# Patient Record
Sex: Male | Born: 1960 | Race: Black or African American | Marital: Married | Smoking: Never smoker
Health system: Southern US, Community
[De-identification: ages and names within clinical notes are randomized; demographics above are authoritative.]

## PROBLEM LIST (undated history)

## (undated) ENCOUNTER — Emergency Department (HOSPITAL_COMMUNITY): Admission: EM | Discharge status: Absent without leave | Payer: BLUE CROSS/BLUE SHIELD

## (undated) DIAGNOSIS — E119 Type 2 diabetes mellitus without complications: Secondary | ICD-10-CM

---

## 2012-12-05 ENCOUNTER — Other Ambulatory Visit: Payer: Self-pay | Admitting: Infectious Diseases

## 2012-12-05 ENCOUNTER — Ambulatory Visit
Admission: RE | Admit: 2012-12-05 | Discharge: 2012-12-05 | Disposition: A | Discharge status: Home / Self Care | Payer: No Typology Code available for payment source | Source: Ambulatory Visit | Attending: Infectious Diseases | Admitting: Infectious Diseases

## 2012-12-05 DIAGNOSIS — R7611 Nonspecific reaction to tuberculin skin test without active tuberculosis: Secondary | ICD-10-CM

## 2015-12-04 ENCOUNTER — Encounter (HOSPITAL_COMMUNITY): Payer: Self-pay | Admitting: Emergency Medicine

## 2015-12-04 ENCOUNTER — Emergency Department (HOSPITAL_COMMUNITY): Payer: BLUE CROSS/BLUE SHIELD

## 2015-12-04 ENCOUNTER — Inpatient Hospital Stay (HOSPITAL_COMMUNITY)
Admission: EM | Admit: 2015-12-04 | Discharge: 2015-12-07 | DRG: 809 | Disposition: A | Discharge status: Home / Self Care | Payer: BLUE CROSS/BLUE SHIELD | Attending: Internal Medicine | Admitting: Internal Medicine

## 2015-12-04 DIAGNOSIS — R509 Fever, unspecified: Secondary | ICD-10-CM | POA: Diagnosis not present

## 2015-12-04 DIAGNOSIS — D72819 Decreased white blood cell count, unspecified: Secondary | ICD-10-CM | POA: Diagnosis not present

## 2015-12-04 DIAGNOSIS — R6510 Systemic inflammatory response syndrome (SIRS) of non-infectious origin without acute organ dysfunction: Secondary | ICD-10-CM | POA: Diagnosis not present

## 2015-12-04 DIAGNOSIS — E119 Type 2 diabetes mellitus without complications: Secondary | ICD-10-CM

## 2015-12-04 DIAGNOSIS — D649 Anemia, unspecified: Secondary | ICD-10-CM | POA: Diagnosis not present

## 2015-12-04 DIAGNOSIS — Z833 Family history of diabetes mellitus: Secondary | ICD-10-CM

## 2015-12-04 DIAGNOSIS — D61818 Other pancytopenia: Secondary | ICD-10-CM | POA: Diagnosis not present

## 2015-12-04 DIAGNOSIS — Z7984 Long term (current) use of oral hypoglycemic drugs: Secondary | ICD-10-CM | POA: Diagnosis not present

## 2015-12-04 DIAGNOSIS — E876 Hypokalemia: Secondary | ICD-10-CM | POA: Diagnosis not present

## 2015-12-04 DIAGNOSIS — I959 Hypotension, unspecified: Secondary | ICD-10-CM | POA: Diagnosis not present

## 2015-12-04 DIAGNOSIS — I10 Essential (primary) hypertension: Secondary | ICD-10-CM | POA: Diagnosis not present

## 2015-12-04 DIAGNOSIS — Z79899 Other long term (current) drug therapy: Secondary | ICD-10-CM

## 2015-12-04 DIAGNOSIS — D696 Thrombocytopenia, unspecified: Secondary | ICD-10-CM | POA: Diagnosis not present

## 2015-12-04 HISTORY — DX: Type 2 diabetes mellitus without complications: E11.9

## 2015-12-04 LAB — CBC WITH DIFFERENTIAL/PLATELET
BASOS PCT: 0 %
Basophils Absolute: 0 10*3/uL (ref 0.0–0.1)
Eosinophils Absolute: 0.4 10*3/uL (ref 0.0–0.7)
Eosinophils Relative: 12 %
HCT: 35.4 % — ABNORMAL LOW (ref 39.0–52.0)
HEMOGLOBIN: 12.5 g/dL — AB (ref 13.0–17.0)
LYMPHS ABS: 0.3 10*3/uL — AB (ref 0.7–4.0)
LYMPHS PCT: 10 %
MCH: 31.6 pg (ref 26.0–34.0)
MCHC: 35.3 g/dL (ref 30.0–36.0)
MCV: 89.6 fL (ref 78.0–100.0)
MONOS PCT: 8 %
Monocytes Absolute: 0.3 10*3/uL (ref 0.1–1.0)
NEUTROS PCT: 69 %
Neutro Abs: 2.1 10*3/uL (ref 1.7–7.7)
Platelets: 57 10*3/uL — ABNORMAL LOW (ref 150–400)
RBC: 3.95 MIL/uL — ABNORMAL LOW (ref 4.22–5.81)
RDW: 14 % (ref 11.5–15.5)
WBC: 3 10*3/uL — ABNORMAL LOW (ref 4.0–10.5)

## 2015-12-04 LAB — COMPREHENSIVE METABOLIC PANEL
ALT: 38 U/L (ref 17–63)
ANION GAP: 11 (ref 5–15)
AST: 36 U/L (ref 15–41)
Albumin: 3.4 g/dL — ABNORMAL LOW (ref 3.5–5.0)
Alkaline Phosphatase: 34 U/L — ABNORMAL LOW (ref 38–126)
BUN: 18 mg/dL (ref 6–20)
CALCIUM: 7.9 mg/dL — AB (ref 8.9–10.3)
CHLORIDE: 107 mmol/L (ref 101–111)
CO2: 19 mmol/L — AB (ref 22–32)
Creatinine, Ser: 1.19 mg/dL (ref 0.61–1.24)
GFR calc non Af Amer: >60 mL/min (ref >60)
Glucose, Bld: 167 mg/dL — ABNORMAL HIGH (ref 65–99)
POTASSIUM: 2.9 mmol/L — AB (ref 3.5–5.1)
SODIUM: 137 mmol/L (ref 135–145)
Total Bilirubin: 1.3 mg/dL — ABNORMAL HIGH (ref 0.3–1.2)
Total Protein: 6 g/dL — ABNORMAL LOW (ref 6.5–8.1)

## 2015-12-04 LAB — MAGNESIUM: Magnesium: 1.5 mg/dL — ABNORMAL LOW (ref 1.7–2.4)

## 2015-12-04 LAB — I-STAT CG4 LACTIC ACID, ED: LACTIC ACID, VENOUS: 1.34 mmol/L (ref 0.5–2.0)

## 2015-12-04 MED ORDER — PIPERACILLIN-TAZOBACTAM 3.375 G IVPB
3.3750 g | Freq: Once | INTRAVENOUS | Status: AC
  Administered 2015-12-04: 3.375 g via INTRAVENOUS
  Filled 2015-12-04: qty 50

## 2015-12-04 MED ORDER — VANCOMYCIN HCL IN DEXTROSE 1-5 GM/200ML-% IV SOLN
1000.0000 mg | INTRAVENOUS | Status: AC
  Administered 2015-12-04: 1000 mg via INTRAVENOUS
  Filled 2015-12-04: qty 200

## 2015-12-04 MED ORDER — SODIUM CHLORIDE 0.9 % IV BOLUS (SEPSIS)
1000.0000 mL | INTRAVENOUS | Status: AC
  Administered 2015-12-04 (×2): 1000 mL via INTRAVENOUS

## 2015-12-04 MED ORDER — VANCOMYCIN HCL IN DEXTROSE 1-5 GM/200ML-% IV SOLN
1000.0000 mg | Freq: Two times a day (BID) | INTRAVENOUS | Status: DC
  Filled 2015-12-04: qty 200

## 2015-12-04 MED ORDER — SODIUM CHLORIDE 0.9 % IV BOLUS (SEPSIS)
500.0000 mL | INTRAVENOUS | Status: AC
Start: 2015-12-04 — End: 2015-12-05
  Administered 2015-12-04: 500 mL via INTRAVENOUS

## 2015-12-04 MED ORDER — POTASSIUM CHLORIDE CRYS ER 20 MEQ PO TBCR
40.0000 meq | EXTENDED_RELEASE_TABLET | Freq: Once | ORAL | Status: AC
  Administered 2015-12-04: 40 meq via ORAL
  Filled 2015-12-04: qty 2

## 2015-12-04 NOTE — ED Notes (Signed)
Pt has been made aware that a urine sample is needed, urinal at the bedside

## 2015-12-04 NOTE — Progress Notes (Addendum)
Pharmacy Antibiotic Note  Marc Clark is a 55 y.o. male [facility]/2015 with hypotension, fatigue, and fever.  Pharmacy has been consulted for Vancomycin and Zosyn dosing for sepsis.   Plan: Vancomycin 1g IV q12h. Plan for Vancomycin trough level at steady state. Goal trough level 15-20 mcg/mL. Zosyn 3.375g IV x 1 given in ED over 30 minutes. Continue with Zosyn 3.375g IV q8h (infuse over 4 hours). Monitor renal function, cultures, clinical course.  Height: 5\' 9"  (175.3 cm) Weight: 185 lb (83.915 kg) IBW/kg (Calculated) : 70.7  Temp (24hrs), Avg:101.2 F (38.4 C), Min:100 F (37.8 C), Max:102.4 F (39.1 C)   Recent Labs Lab 12/04/15 2110 12/04/15 2122  WBC 3.0*  --   CREATININE 1.19  --   LATICACIDVEN  --  1.34    Estimated Creatinine Clearance: 72.6 mL/min (by C-G formula based on Cr of 1.19).    No Known Allergies  Antimicrobials this admission: 4/28 >> Zosyn >> 4/28 >> Vancomycin >>  Dose adjustments this admission: ---  Microbiology results: 4/28 BCx: sent 4/28 UCx: sent  Thank you for allowing pharmacy to be a part of this patient's care.    Greer PickerelJigna Landri Dorsainvil, PharmD, BCPS Pager: [phone] 12/04/2015 10:45 PM

## 2015-12-04 NOTE — ED Notes (Addendum)
Pt from an urgent care via EMS with complaints of hypotension and lethargy x 4 days. Pt denies dizziness, lightheadedness ,LOC, n, v, d. At the urgent care the pt's bp was 77/56. At time of assessment, pt's bp is 94/57. Pt states he has not eaten in 2 days except for a hamburger he ate before he went to the UC this afternoon. Pt has an IV and has had about 550ml of NS. Pt had a fever of 100.4 at UC and EMS gave 1000mg  of tylenol PO

## 2015-12-04 NOTE — ED Provider Notes (Signed)
CSN: [id number]     Arrival date & time 12/04/15  1935 History   First MD Initiated Contact with Patient 12/04/15 2049     Chief Complaint  Patient presents with  . Hypotension  . Fatigue     The history is provided by the patient. No language interpreter was used.  Marc Clark is a 55 y.o. male who presents to the Emergency Department complaining of weakness.  Patient reports 4 days of generalized weakness, fatigue, malaise. He presented to urgent care for evaluation and was noted to be hypotensive with a blood pressure 77/56. He denies any fever, cough, nausea, vomiting, diarrhea, dysuria. He has a history of diabetes and takes metformin, no other medical problems. He is from [place] but has lived in the [place] for the last 15 years with no recent travel. No significant past family medical history. Symptoms are severe, constant, worsening.  Past Medical History  Diagnosis Date  . Diabetes mellitus type 2 in nonobese (HCC)   . Diabetes mellitus type 2, controlled (HCC) 12/05/2015   History reviewed. No pertinent past surgical history. Family History  Problem Relation Age of Onset  . Diabetes Mellitus II Mother    Social History  Substance Use Topics  . Smoking status: Never Smoker   . Smokeless tobacco: None  . Alcohol Use: No    Review of Systems  All other systems reviewed and are negative.     Allergies  Review of patient's allergies indicates no known allergies.  Home Medications   Prior to Admission medications   Medication Sig Start Date End Date Taking? Authorizing Provider  acetaminophen (TYLENOL) 500 MG tablet Take 1,000 mg by mouth every 6 (six) hours as needed for moderate pain.   Yes Historical Provider, MD  metFORMIN (GLUCOPHAGE) 500 MG tablet Take 500 mg by mouth 2 (two) times daily with a meal.   Yes Historical Provider, MD  Multiple Vitamin (MULTIVITAMIN WITH MINERALS) TABS tablet Take 1 tablet by mouth daily.   Yes Historical Provider, MD  PRESCRIPTION  MEDICATION Take 1 tablet by mouth daily. For diabetes   Yes Historical Provider, MD   BP 94/55 mmHg  Pulse 64  Temp(Src) 98.2 F (36.8 C) (Oral)  Resp 20  Ht 5\' 9"  (1.753 m)  Wt 190 lb 11.2 oz (86.5 kg)  BMI 28.15 kg/m2  SpO2 100% Physical Exam  Constitutional: He is oriented to person, place, and time. He appears well-developed and well-nourished.  HENT:  Head: Normocephalic and atraumatic.  Cardiovascular: Normal rate and regular rhythm.   No murmur heard. Pulmonary/Chest: Effort normal and breath sounds normal. No respiratory distress.  Abdominal: Soft. There is no tenderness. There is no rebound and no guarding.  Musculoskeletal: He exhibits no edema or tenderness.  Neurological: He is alert and oriented to person, place, and time.  Skin: Skin is warm and dry.  Psychiatric: He has a normal mood and affect. His behavior is normal.  Nursing note and vitals reviewed.   ED Course  Procedures (including critical care time) Labs Review Labs Reviewed  COMPREHENSIVE METABOLIC PANEL - Abnormal; Notable for the following:    Potassium 2.9 (*)    CO2 19 (*)    Glucose, Bld 167 (*)    Calcium 7.9 (*)    Total Protein 6.0 (*)    Albumin 3.4 (*)    Alkaline Phosphatase 34 (*)    Total Bilirubin 1.3 (*)    All other components within normal limits  CBC WITH DIFFERENTIAL/PLATELET - Abnormal;  Notable for the following:    WBC 3.0 (*)    RBC 3.95 (*)    Hemoglobin 12.5 (*)    HCT 35.4 (*)    Platelets 57 (*)    Lymphs Abs 0.3 (*)    All other components within normal limits  URINALYSIS, ROUTINE W REFLEX MICROSCOPIC (NOT AT [facility]) - Abnormal; Notable for the following:    Specific Gravity, Urine 1.039 (*)    Glucose, UA >1000 (*)    Hgb urine dipstick TRACE (*)    Ketones, ur 40 (*)    All other components within normal limits  MAGNESIUM - Abnormal; Notable for the following:    Magnesium 1.5 (*)    All other components within normal limits  URINE MICROSCOPIC-ADD ON -  Abnormal; Notable for the following:    Squamous Epithelial / LPF 6-30 (*)    Bacteria, UA RARE (*)    All other components within normal limits  CULTURE, BLOOD (ROUTINE X 2)  CULTURE, BLOOD (ROUTINE X 2)  URINE CULTURE  HIV ANTIBODY (ROUTINE TESTING)  COMPREHENSIVE METABOLIC PANEL  CBC WITH DIFFERENTIAL/PLATELET  PATHOLOGIST SMEAR REVIEW  LACTATE DEHYDROGENASE  MAGNESIUM  I-STAT CG4 LACTIC ACID, ED    Imaging Review Dg Chest Port 1 View  12/04/2015  CLINICAL DATA:  Acute onset of fever and lethargy. Hypotension. Initial encounter. EXAM: PORTABLE CHEST 1 VIEW COMPARISON:  Chest radiograph performed 12/05/2012 FINDINGS: The lungs are well-aerated. Pulmonary vascularity is at the upper limits of normal. There is no evidence of focal opacification, pleural effusion or pneumothorax. The cardiomediastinal silhouette is within normal limits. No acute osseous abnormalities are seen. IMPRESSION: No acute cardiopulmonary process seen. Electronically Signed   By: Roanna Raider M.D.   On: 12/04/2015 21:57   I have personally reviewed and evaluated these images and lab results as part of my medical decision-making.   EKG Interpretation   Date/Time:  Friday December 04 2015 20:57:20 EDT Ventricular Rate:  80 PR Interval:  201 QRS Duration: 80 QT Interval:  370 QTC Calculation: 427 R Axis:   62 Text Interpretation:  Sinus rhythm Confirmed by Lincoln Brigham [phone]) on  12/04/2015 9:03:00 PM      MDM   Final diagnoses:  None    Patient here for evaluation of fatigue and lethargy. He is hypotensive on ED arrival sepsis protocol initiated due to concern for potential underlying infection. On further evaluation there is no evidence of clear infection at this time. Patient was started on antibiotics pending further evaluation. He is noted to be febrile with pancytopenia, hypokalemia.  No evidence of acute UTI, pneumonia, intra-abdominal process, no skin lesions. Hypotension improved after IV fluid  administration. Plan to admit to the hospitalist service for further evaluation. Patient updated of findings of studies recommendation for admission.    Tilden Fossa, MD 12/05/15 [phone]

## 2015-12-05 ENCOUNTER — Encounter (HOSPITAL_COMMUNITY): Payer: Self-pay | Admitting: Internal Medicine

## 2015-12-05 ENCOUNTER — Inpatient Hospital Stay (HOSPITAL_COMMUNITY): Payer: BLUE CROSS/BLUE SHIELD

## 2015-12-05 DIAGNOSIS — R6510 Systemic inflammatory response syndrome (SIRS) of non-infectious origin without acute organ dysfunction: Secondary | ICD-10-CM | POA: Diagnosis present

## 2015-12-05 DIAGNOSIS — D61818 Other pancytopenia: Secondary | ICD-10-CM | POA: Diagnosis present

## 2015-12-05 DIAGNOSIS — E119 Type 2 diabetes mellitus without complications: Secondary | ICD-10-CM

## 2015-12-05 HISTORY — DX: Type 2 diabetes mellitus without complications: E11.9

## 2015-12-05 LAB — COMPREHENSIVE METABOLIC PANEL
ALBUMIN: 3.2 g/dL — AB (ref 3.5–5.0)
ALT: 37 U/L (ref 17–63)
AST: 31 U/L (ref 15–41)
Alkaline Phosphatase: 33 U/L — ABNORMAL LOW (ref 38–126)
Anion gap: 7 (ref 5–15)
BUN: 16 mg/dL (ref 6–20)
CHLORIDE: 112 mmol/L — AB (ref 101–111)
CO2: 23 mmol/L (ref 22–32)
Calcium: 7.8 mg/dL — ABNORMAL LOW (ref 8.9–10.3)
Creatinine, Ser: 1.1 mg/dL (ref 0.61–1.24)
GFR calc Af Amer: >60 mL/min (ref >60)
GLUCOSE: 158 mg/dL — AB (ref 65–99)
POTASSIUM: 4.3 mmol/L (ref 3.5–5.1)
SODIUM: 142 mmol/L (ref 135–145)
Total Bilirubin: 0.8 mg/dL (ref 0.3–1.2)
Total Protein: 5.9 g/dL — ABNORMAL LOW (ref 6.5–8.1)

## 2015-12-05 LAB — CBC WITH DIFFERENTIAL/PLATELET
Basophils Absolute: 0 10*3/uL (ref 0.0–0.1)
Basophils Relative: 1 %
EOS PCT: 6 %
Eosinophils Absolute: 0.3 10*3/uL (ref 0.0–0.7)
HCT: 34.3 % — ABNORMAL LOW (ref 39.0–52.0)
Hemoglobin: 11.9 g/dL — ABNORMAL LOW (ref 13.0–17.0)
LYMPHS ABS: 1 10*3/uL (ref 0.7–4.0)
LYMPHS PCT: 23 %
MCH: 30.4 pg (ref 26.0–34.0)
MCHC: 34.7 g/dL (ref 30.0–36.0)
MCV: 87.7 fL (ref 78.0–100.0)
MONOS PCT: 12 %
Monocytes Absolute: 0.5 10*3/uL (ref 0.1–1.0)
Neutro Abs: 2.5 10*3/uL (ref 1.7–7.7)
Neutrophils Relative %: 59 %
PLATELETS: 52 10*3/uL — AB (ref 150–400)
RBC: 3.91 MIL/uL — AB (ref 4.22–5.81)
RDW: 14.4 % (ref 11.5–15.5)
WBC: 4.3 10*3/uL (ref 4.0–10.5)

## 2015-12-05 LAB — URINALYSIS, ROUTINE W REFLEX MICROSCOPIC
Bilirubin Urine: NEGATIVE
Glucose, UA: >1000 mg/dL — AB
Ketones, ur: 40 mg/dL — AB
LEUKOCYTES UA: NEGATIVE
Nitrite: NEGATIVE
PROTEIN: NEGATIVE mg/dL
SPECIFIC GRAVITY, URINE: 1.039 — AB (ref 1.005–1.030)
pH: 5 (ref 5.0–8.0)

## 2015-12-05 LAB — GLUCOSE, CAPILLARY
GLUCOSE-CAPILLARY: 93 mg/dL (ref 65–99)
GLUCOSE-CAPILLARY: 111 mg/dL — AB (ref 65–99)
Glucose-Capillary: 94 mg/dL (ref 65–99)
Glucose-Capillary: 121 mg/dL — ABNORMAL HIGH (ref 65–99)

## 2015-12-05 LAB — LACTATE DEHYDROGENASE: LDH: 243 U/L — ABNORMAL HIGH (ref 98–192)

## 2015-12-05 LAB — URINE MICROSCOPIC-ADD ON

## 2015-12-05 LAB — MAGNESIUM: Magnesium: 2.4 mg/dL (ref 1.7–2.4)

## 2015-12-05 MED ORDER — SODIUM CHLORIDE 0.45 % IV SOLN
INTRAVENOUS | Status: DC
  Administered 2015-12-05: 50 mL/h via INTRAVENOUS
  Administered 2015-12-06: 05:00:00 via INTRAVENOUS

## 2015-12-05 MED ORDER — INSULIN ASPART 100 UNIT/ML ~~LOC~~ SOLN: 0.0000 [IU] | Freq: Every day | SUBCUTANEOUS | Status: DC

## 2015-12-05 MED ORDER — ONDANSETRON HCL 4 MG/2ML IJ SOLN: 4.0000 mg | Freq: Four times a day (QID) | INTRAMUSCULAR | Status: DC | PRN

## 2015-12-05 MED ORDER — INSULIN ASPART 100 UNIT/ML ~~LOC~~ SOLN: 0.0000 [IU] | Freq: Three times a day (TID) | SUBCUTANEOUS | Status: DC

## 2015-12-05 MED ORDER — INSULIN ASPART 100 UNIT/ML ~~LOC~~ SOLN
0.0000 [IU] | Freq: Three times a day (TID) | SUBCUTANEOUS | Status: DC
  Administered 2015-12-06: 2 [IU] via SUBCUTANEOUS

## 2015-12-05 MED ORDER — ACETAMINOPHEN 325 MG PO TABS
650.0000 mg | ORAL_TABLET | Freq: Four times a day (QID) | ORAL | Status: DC | PRN
  Administered 2015-12-06 – 2015-12-07 (×3): 650 mg via ORAL
  Filled 2015-12-05 (×3): qty 2

## 2015-12-05 MED ORDER — PIPERACILLIN-TAZOBACTAM 3.375 G IVPB
3.3750 g | Freq: Three times a day (TID) | INTRAVENOUS | Status: DC
  Administered 2015-12-05: 3.375 g via INTRAVENOUS
  Filled 2015-12-05 (×2): qty 50

## 2015-12-05 MED ORDER — POTASSIUM CHLORIDE CRYS ER 20 MEQ PO TBCR
40.0000 meq | EXTENDED_RELEASE_TABLET | Freq: Once | ORAL | Status: AC
  Administered 2015-12-05: 40 meq via ORAL
  Filled 2015-12-05: qty 2

## 2015-12-05 MED ORDER — ONDANSETRON HCL 4 MG PO TABS: 4.0000 mg | ORAL_TABLET | Freq: Four times a day (QID) | ORAL | Status: DC | PRN

## 2015-12-05 MED ORDER — DIATRIZOATE MEGLUMINE & SODIUM 66-10 % PO SOLN
30.0000 mL | Freq: Once | ORAL | Status: AC
  Administered 2015-12-05: 30 mL via ORAL
  Filled 2015-12-05: qty 30

## 2015-12-05 MED ORDER — INSULIN ASPART 100 UNIT/ML ~~LOC~~ SOLN: 4.0000 [IU] | Freq: Three times a day (TID) | SUBCUTANEOUS | Status: DC

## 2015-12-05 MED ORDER — SODIUM CHLORIDE 0.9 % IV SOLN
INTRAVENOUS | Status: DC
  Administered 2015-12-05: 01:00:00 via INTRAVENOUS

## 2015-12-05 MED ORDER — IOPAMIDOL (ISOVUE-300) INJECTION 61%
100.0000 mL | Freq: Once | INTRAVENOUS | Status: AC | PRN
  Administered 2015-12-05: 100 mL via INTRAVENOUS

## 2015-12-05 MED ORDER — ACETAMINOPHEN 650 MG RE SUPP: 650.0000 mg | Freq: Four times a day (QID) | RECTAL | Status: DC | PRN

## 2015-12-05 MED ORDER — MAGNESIUM SULFATE 2 GM/50ML IV SOLN
2.0000 g | Freq: Once | INTRAVENOUS | Status: AC
  Administered 2015-12-05: 2 g via INTRAVENOUS
  Filled 2015-12-05: qty 50

## 2015-12-05 NOTE — Progress Notes (Signed)
[facility] PROGRESS NOTE    Progress Note  Marc Clark  ZOX:[id number] DOB: Mar 12, 1961 DOA: 12/04/2015 PCP: No primary care provider on file.  Outpatient Specialists:    Brief Narrative:   Marc Clark is an 55 y.o. male past medical history of diabetes mellitus comes to the ER for increased weakness and fever and chills  Assessment/Plan:   Fevers/SIRS (systemic inflammatory response syndrome) (HCC)/ Pancytopenia (HCC): Started on empiric antibiotics vancomycin and Zosyn on admission, chest x-ray is clear UA shows, 6-30 white blood cells Chest x-ray did not show any infiltrates, HIV is pending. Hold empiric antibiotics. I am more concerned about peripheral nerve disorder as leukemia or lymphoma and for HIV. Cultures are pending. CT scan of the abdomen pelvis and chest with contrast  Hypotension Resolved with IV fluid hydration.  Diabetes mellitus type 2, controlled (HCC): BC metformin check a hemoglobin A1c start sliding scale insulin.     DVT prophylaxis: heparin order Family Communication:Wife Disposition Plan: inpatient Code Status:     Code Status Orders        Start     Ordered   12/05/15 0050  Full code   Continuous     12/05/15 0052    Code Status History    Date Active Date Inactive Code Status Order ID Comments User Context   This patient has a current code status but no historical code status.        IV Access:    Peripheral IV   Procedures and diagnostic studies:   Dg Chest Port 1 View  12/04/2015  CLINICAL DATA:  Acute onset of fever and lethargy. Hypotension. Initial encounter. EXAM: PORTABLE CHEST 1 VIEW COMPARISON:  Chest radiograph performed 12/05/2012 FINDINGS: The lungs are well-aerated. Pulmonary vascularity is at the upper limits of normal. There is no evidence of focal opacification, pleural effusion or pneumothorax. The cardiomediastinal silhouette is within normal limits. No acute osseous abnormalities are seen.  IMPRESSION: No acute cardiopulmonary process seen. Electronically Signed   By: Roanna RaiderJeffery  Chang M.D.   On: 12/04/2015 21:57     Medical Consultants:    None.  Anti-Infectives:   1 dose of Vanco and Zosyn  Subjective:    Marc Clark UAC is much better than when he came in, he relates generalized weakness and fever for a week prior to admission.  Objective:    Filed Vitals:   12/04/15 2215 12/04/15 2330 12/05/15 0029 12/05/15 0519  BP: 101/58 100/66 94/55 93/60   Pulse: 74 73 64 68  Temp: 98.3 F (36.8 C) 98.3 F (36.8 C) 98.2 F (36.8 C) 97.7 F (36.5 C)  TempSrc: Oral Oral Oral Oral  Resp: 23 19 20 18   Height:   5\' 9"  (1.753 m)   Weight:   86.5 kg (190 lb 11.2 oz)   SpO2: 97% 96% 100% 99%   No intake or output data in the 24 hours ending 12/05/15 0751 Filed Weights   12/04/15 1948 12/05/15 0029  Weight: 83.915 kg (185 lb) 86.5 kg (190 lb 11.2 oz)    Exam: General exam: In no acute distress. Respiratory system: Good air movement and clear to auscultation. Cardiovascular system: S1 & S2 heard, RRR. No JVD, murmurs, rubs, gallops or clicks.  Gastrointestinal system: Abdomen is nondistended, soft and nontender.  Central nervous system: Alert and oriented. No focal neurological deficits. Lymphatic system: No palpable lymphadenopathy Skin: No rashes, lesions or ulcers Psychiatry: Judgement and insight appear normal. Mood & affect appropriate.    Data Reviewed:  Labs: Basic Metabolic Panel:  Recent Labs Lab 12/04/15 2110 12/05/15 0438  NA 137 142  K 2.9* 4.3  CL 107 112*  CO2 19* 23  GLUCOSE 167* 158*  BUN 18 16  CREATININE 1.19 1.10  CALCIUM 7.9* 7.8*  MG 1.5* 2.4   GFR Estimated Creatinine Clearance: 85.6 mL/min (by C-G formula based on Cr of 1.1). Liver Function Tests:  Recent Labs Lab 12/04/15 2110 12/05/15 0438  AST 36 31  ALT 38 37  ALKPHOS 34* 33*  BILITOT 1.3* 0.8  PROT 6.0* 5.9*  ALBUMIN 3.4* 3.2*   No results for input(s):  LIPASE, AMYLASE in the last 168 hours. No results for input(s): AMMONIA in the last 168 hours. Coagulation profile No results for input(s): INR, PROTIME in the last 168 hours.  CBC:  Recent Labs Lab 12/04/15 2110 12/05/15 0438  WBC 3.0* 4.3  NEUTROABS 2.1 2.5  HGB 12.5* 11.9*  HCT 35.4* 34.3*  MCV 89.6 87.7  PLT 57* 52*   Cardiac Enzymes: No results for input(s): CKTOTAL, CKMB, CKMBINDEX, TROPONINI in the last 168 hours. BNP (last 3 results) No results for input(s): PROBNP in the last 8760 hours. CBG: No results for input(s): GLUCAP in the last 168 hours. D-Dimer: No results for input(s): DDIMER in the last 72 hours. Hgb A1c: No results for input(s): HGBA1C in the last 72 hours. Lipid Profile: No results for input(s): CHOL, HDL, LDLCALC, TRIG, CHOLHDL, LDLDIRECT in the last 72 hours. Thyroid function studies: No results for input(s): TSH, T4TOTAL, T3FREE, THYROIDAB in the last 72 hours.  Invalid input(s): FREET3 Anemia work up: No results for input(s): VITAMINB12, FOLATE, FERRITIN, TIBC, IRON, RETICCTPCT in the last 72 hours. Sepsis Labs:  Recent Labs Lab 12/04/15 2110 12/04/15 2122 12/05/15 0438  WBC 3.0*  --  4.3  LATICACIDVEN  --  1.34  --    Microbiology No results found for this or any previous visit (from the past 240 hour(s)).   Medications:   . insulin aspart  0-9 Units Subcutaneous TID WC  . piperacillin-tazobactam (ZOSYN)  IV  3.375 g Intravenous Q8H  . vancomycin  1,000 mg Intravenous Q12H   Continuous Infusions: . sodium chloride 150 mL/hr at 12/05/15 0108    Time spent: 25 min   LOS: 1 day   Marinda Elk  [facility] Pager [phone]  *Please refer to [email or web address], password [email or web address] to get updated schedule on who will round on this patient, as hospitalists switch teams weekly. If 7PM-7AM, please contact night-coverage at [email or web address], password [email or web address] for any overnight needs.  12/05/2015, 7:51 AM

## 2015-12-05 NOTE — H&P (Signed)
History and Physical    Marc Clark ZOX:[id number] DOB: 1961/05/09 DOA: 12/04/2015  Referring MD/NP/PA: Dr.Rees. PCP: No primary care provider on file. Dr.Avburre. Outpatient Specialists: None Patient coming from: Home.  Chief Complaint: Fever and weakness.  HPI: Marc Clark is a 55 y.o. male with medical history significant of with history of diabetes mellitus type 2 who was recently started on Jardiance 3 weeks ago and until then patient was on metformin presents to the ER because of increasing weakness with fever and chills over the last 4 days. Patient denies any shortness of breath productive cough nausea vomiting abdominal pain diarrhea or any dysuria. Patient is initially hypotensive in the ER and was given 1 L fluid bolus followed which patient's blood pressure improved more than 100 systolic. The patient's temperature was around 102F on presentation. Chest x-ray and UA are unremarkable. Patient's blood work showed pancytopenia. Patient denies any recent travel or sick contacts.  ED Course: As in history of present illness.  Review of Systems: As per HPI otherwise 10 point review of systems negative.    Past Medical History  Diagnosis Date  . Diabetes mellitus type 2 in nonobese (HCC)   . Diabetes mellitus type 2, controlled (HCC) 12/05/2015    History reviewed. No pertinent past surgical history.   reports that he has never smoked. He does not have any smokeless tobacco history on file. He reports that he does not drink alcohol. His drug history is not on file.  No Known Allergies  Family History  Problem Relation Age of Onset  . Diabetes Mellitus II Mother     Prior to Admission medications   Medication Sig Start Date End Date Taking? Authorizing Provider  acetaminophen (TYLENOL) 500 MG tablet Take 1,000 mg by mouth every 6 (six) hours as needed for moderate pain.   Yes Historical Provider, MD  metFORMIN (GLUCOPHAGE) 500 MG tablet Take 500 mg by mouth 2  (two) times daily with a meal.   Yes Historical Provider, MD  Multiple Vitamin (MULTIVITAMIN WITH MINERALS) TABS tablet Take 1 tablet by mouth daily.   Yes Historical Provider, MD  PRESCRIPTION MEDICATION Take 1 tablet by mouth daily. For diabetes   Yes Historical Provider, MD    Physical Exam: Filed Vitals:   12/04/15 2120 12/04/15 2215 12/04/15 2330 12/05/15 0029  BP: 91/56 101/58 100/66 94/55  Pulse: 78 74 73 64  Temp:  98.3 F (36.8 C) 98.3 F (36.8 C) 98.2 F (36.8 C)  TempSrc:  Oral Oral Oral  Resp: Height:     (1.753 m)  Weight:    190 lb 11.2 oz (86.5 kg)  SpO2: 96% 97% 96% 100%      Constitutional: NAD, calm, comfortable Filed Vitals:   12/04/15 2120 12/04/15 2215 12/04/15 2330 12/05/15 0029  BP: 91/56 101/58 100/66 94/55  Pulse: 78 74 73 64  Temp:  98.3 F (36.8 C) 98.3 F (36.8 C) 98.2 F (36.8 C)  TempSrc:  Oral Oral Oral  Resp: Height:     (1.753 m)  Weight:    190 lb 11.2 oz (86.5 kg)  SpO2: 96% 97% 96% 100%   Eyes: PERRL, lids and conjunctivae normal ENMT: Mucous membranes are moist. Posterior pharynx clear of any exudate or lesions.Normal dentition.  Neck: normal, supple, no masses, no thyromegaly Respiratory: clear to auscultation bilaterally, no wheezing, no crackles. Normal respiratory effort. No accessory muscle use.  Cardiovascular: Regular rate and rhythm,  no murmurs / rubs / gallops. No extremity edema. 2+ pedal pulses. No carotid bruits.  Abdomen: no tenderness, no masses palpated. No hepatosplenomegaly. Bowel sounds positive.  Musculoskeletal: no clubbing / cyanosis. No joint deformity upper and lower extremities. Good ROM, no contractures. Normal muscle tone.  Skin: no rashes, lesions, ulcers. No induration Neurologic: CN 2-12 grossly intact. Sensation intact, DTR normal. Strength 5/5 in all 4.  Psychiatric: Normal judgment and insight. Alert and oriented x 3. Normal mood.    Labs on Admission: I have  personally reviewed following labs and imaging studies  CBC:  Recent Labs Lab 12/04/15 2110  WBC 3.0*  NEUTROABS 2.1  HGB 12.5*  HCT 35.4*  MCV 89.6  PLT 57*   Basic Metabolic Panel:  Recent Labs Lab 12/04/15 2110  NA 137  K 2.9*  CL 107  CO2 19*  GLUCOSE 167*  BUN 18  CREATININE 1.19  CALCIUM 7.9*  MG 1.5*   GFR: Estimated Creatinine Clearance: 79.1 mL/min (by C-G formula based on Cr of 1.19). Liver Function Tests:  Recent Labs Lab 12/04/15 2110  AST 36  ALT 38  ALKPHOS 34*  BILITOT 1.3*  PROT 6.0*  ALBUMIN 3.4*   No results for input(s): LIPASE, AMYLASE in the last 168 hours. No results for input(s): AMMONIA in the last 168 hours. Coagulation Profile: No results for input(s): INR, PROTIME in the last 168 hours. Cardiac Enzymes: No results for input(s): CKTOTAL, CKMB, CKMBINDEX, TROPONINI in the last 168 hours. BNP (last 3 results) No results for input(s): PROBNP in the last 8760 hours. HbA1C: No results for input(s): HGBA1C in the last 72 hours. CBG: No results for input(s): GLUCAP in the last 168 hours. Lipid Profile: No results for input(s): CHOL, HDL, LDLCALC, TRIG, CHOLHDL, LDLDIRECT in the last 72 hours. Thyroid Function Tests: No results for input(s): TSH, T4TOTAL, FREET4, T3FREE, THYROIDAB in the last 72 hours. Anemia Panel: No results for input(s): VITAMINB12, FOLATE, FERRITIN, TIBC, IRON, RETICCTPCT in the last 72 hours. Urine analysis:    Component Value Date/Time   COLORURINE YELLOW 12/04/2015 2348   APPEARANCEUR CLEAR 12/04/2015 2348   LABSPEC 1.039* 12/04/2015 2348   PHURINE 5.0 12/04/2015 2348   GLUCOSEU >1000* 12/04/2015 2348   HGBUR TRACE* 12/04/2015 2348   BILIRUBINUR NEGATIVE 12/04/2015 2348   KETONESUR 40* 12/04/2015 2348   PROTEINUR NEGATIVE 12/04/2015 2348   NITRITE NEGATIVE 12/04/2015 2348   LEUKOCYTESUR NEGATIVE 12/04/2015 2348   Sepsis Labs: (procalcitonin:4,lacticidven:4) )No results found for this or  any previous visit (from the past 240 hour(s)).   Radiological Exams on Admission: Dg Chest Port 1 View  12/04/2015  CLINICAL DATA:  Acute onset of fever and lethargy. Hypotension. Initial encounter. EXAM: PORTABLE CHEST 1 VIEW COMPARISON:  Chest radiograph performed 12/05/2012 FINDINGS: The lungs are well-aerated. Pulmonary vascularity is at the upper limits of normal. There is no evidence of focal opacification, pleural effusion or pneumothorax. The cardiomediastinal silhouette is within normal limits. No acute osseous abnormalities are seen. IMPRESSION: No acute cardiopulmonary process seen. Electronically Signed   By: Roanna Raider M.D.   On: 12/04/2015 21:57    EKG: Independently reviewed. Normal sinus rhythm.  Assessment/Plan Principal Problem:   SIRS (systemic inflammatory response syndrome) (HCC) Active Problems:   Hypotension   Diabetes mellitus type 2, controlled (HCC)   Pancytopenia (HCC)    #1. SIRS - cause is not clear. At this time assuming infectious cause patient has been placed empirically on vancomycin and Zosyn. Follow blood cultures urine cultures  and procalcitonin levels. Continue with aggressive hydration. #2. Pancytopenia - could be from #1. Check HIV status, blood smear study and LDH levels. Patient's creatinine is normal. Closely follow CBC. Patient may need hematology consult. #3. Diabetes mellitus type 2 - patient was recently started on Jardiance which I will hold off for now. While inpatient and we'll hold off metformin. For now I'll place patient on sliding scale coverage but patient may need long-acting insulin while inpatient. This depends on patient's CBGs. Continue with hydration.   DVT prophylaxis: SCDs because of thrombocytopenia. Code Status: Full code.  Family Communication: Discussed with patient's wife.  Disposition Plan: Home.  Consults called: None.  Admission status: Inpatient. Likely stay would be 3 days.    Eduard ClosKAKRAKANDY,Damyiah Moxley N. MD [facility] Pager [phone].  If 7PM-7AM, please contact night-coverage [email or web address] Password [email or web address]  12/05/2015, 12:54 AM

## 2015-12-06 DIAGNOSIS — D61818 Other pancytopenia: Principal | ICD-10-CM

## 2015-12-06 DIAGNOSIS — E118 Type 2 diabetes mellitus with unspecified complications: Secondary | ICD-10-CM

## 2015-12-06 DIAGNOSIS — R6510 Systemic inflammatory response syndrome (SIRS) of non-infectious origin without acute organ dysfunction: Secondary | ICD-10-CM

## 2015-12-06 DIAGNOSIS — I959 Hypotension, unspecified: Secondary | ICD-10-CM

## 2015-12-06 LAB — GLUCOSE, CAPILLARY
GLUCOSE-CAPILLARY: 160 mg/dL — AB (ref 65–99)
Glucose-Capillary: 90 mg/dL (ref 65–99)
Glucose-Capillary: 150 mg/dL — ABNORMAL HIGH (ref 65–99)
Glucose-Capillary: 103 mg/dL — ABNORMAL HIGH (ref 65–99)

## 2015-12-06 LAB — URINE CULTURE: Culture: NO GROWTH

## 2015-12-06 LAB — HIV ANTIBODY (ROUTINE TESTING W REFLEX): HIV SCREEN 4TH GENERATION: NONREACTIVE

## 2015-12-06 NOTE — Progress Notes (Signed)
This nurse assumed care of patient at 1300. Agree with previous RN's assessment. 

## 2015-12-06 NOTE — Progress Notes (Signed)
PROGRESS NOTE    Marc Clark  ZOX:[id number] DOB: 10/09/1960 DOA: 12/04/2015 PCP: No primary care provider on file.  Outpatient Specialists: None  Brief Narrative:  (HPI on 12/05/2015 by Dr. Midge Minium) Marc Clark is a 55 y.o. male with medical history significant of with history of diabetes mellitus type 2 who was recently started on Jardiance 3 weeks ago and until then patient was on metformin presents to the ER because of increasing weakness with fever and chills over the last 4 days. Patient denies any shortness of breath productive cough nausea vomiting abdominal pain diarrhea or any dysuria. Patient is initially hypotensive in the ER and was given 1 L fluid bolus followed which patient's blood pressure improved more than 100 systolic. The patient's temperature was around 102F on presentation. Chest x-ray and UA are unremarkable. Patient's blood work showed pancytopenia. Patient denies any recent travel or sick contacts.  Assessment & Plan   Fevers/SIRS (systemic inflammatory response syndrome) (HCC)/ Pancytopenia (HCC): -Started on empiric antibiotics vancomycin and Zosyn on admission -UA shows, 6-30 white blood cells  -Chest x-ray did not show any infiltrates -HIV nonreactive -Blood cultures and urine culture pending -Antibiotics held -CT chest, abdomen and pelvis: Nonspecific mild fat stranding about the ureters bilaterally, descending and sigmoid colon thickening question colitis. No acute process within the chest, abdomen or pelvis -? Concern for leukemia or lymphoma -?Viral cause  Hypotension -Resolved with IV fluid hydration.  Diabetes mellitus type 2, controlled (HCC): -metformin held, continue ISS with CBG monitoring -Hemoglobin A1c pending  DVT Prophylaxis  SCDs  Code Status: Full  Family Communication: Family at bedside  Disposition Plan: Admitted. Pending blood and urine cultures.   Consultants None  Procedures  None  Antibiotics     Anti-infectives    Start     Dose/Rate Route Frequency Ordered Stop   12/05/15 1000  vancomycin (VANCOCIN) IVPB 1000 mg/200 mL premix  Status:  Discontinued     1,000 mg 200 mL/hr over 60 Minutes Intravenous Every 12 hours 12/04/15 2242 12/05/15 0807   12/05/15 0400  piperacillin-tazobactam (ZOSYN) IVPB 3.375 g  Status:  Discontinued     3.375 g 12.5 mL/hr over 240 Minutes Intravenous Every 8 hours 12/05/15 0106 12/05/15 0807   12/04/15 2245  vancomycin (VANCOCIN) IVPB 1000 mg/200 mL premix     1,000 mg 200 mL/hr over 60 Minutes Intravenous STAT 12/04/15 2241 12/05/15 0027   12/04/15 2145  piperacillin-tazobactam (ZOSYN) IVPB 3.375 g     3.375 g 12.5 mL/hr over 240 Minutes Intravenous  Once 12/04/15 2132 12/04/15 2300      Subjective:   Marc Clark seen and examined today.  Patient states he is feeling better today.  Denies chest pain, shortness of breath, dizziness, headache, cough, abdominal pain, nausea or vomiting, diarrhea or constipation, dysuria, urinary urgency or frequency.  Objective:   Filed Vitals:   12/05/15 1500 12/05/15 2010 12/06/15 0500 12/06/15 0521  BP: 101/59 108/69  110/64  Pulse: 64 68  70  Temp: 98.1 F (36.7 C) 98.1 F (36.7 C)  98 F (36.7 C)  TempSrc: Oral Oral  Oral  Resp: 20 20  20   Height:      Weight:   85.9 kg (189 lb 6 oz)   SpO2: 100% 100%  100%    Intake/Output Summary (Last 24 hours) at 12/06/15 1033 Last data filed at 12/06/15 0524  Gross per 24 hour  Intake 1368.33 ml  Output      0 ml  Net 1368.33  ml   Filed Weights   12/04/15 1948 12/05/15 0029 12/06/15 0500  Weight: 83.915 kg (185 lb) 86.5 kg (190 lb 11.2 oz) 85.9 kg (189 lb 6 oz)    Exam  General: Well developed, well nourished, NAD, appears stated age  HEENT: NCAT, mucous membranes moist.   Neck: Supple, no JVD, no masses  Cardiovascular: S1 S2 auscultated, no rubs, murmurs or gallops. Regular rate and rhythm.  Respiratory: Clear to auscultation bilaterally  with equal chest rise  Abdomen: Soft, nontender, nondistended, + bowel sounds  Extremities: warm dry without cyanosis clubbing or edema  Neuro: AAOx3, nonfocal  Psych: Normal affect and demeanor with intact judgement and insight   Data Reviewed: I have personally reviewed following labs and imaging studies  CBC:  Recent Labs Lab 12/04/15 2110 12/05/15 0438  WBC 3.0* 4.3  NEUTROABS 2.1 2.5  HGB 12.5* 11.9*  HCT 35.4* 34.3*  MCV 89.6 87.7  PLT 57* 52*   Basic Metabolic Panel:  Recent Labs Lab 12/04/15 2110 12/05/15 0438  NA 137 142  K 2.9* 4.3  CL 107 112*  CO2 19* 23  GLUCOSE 167* 158*  BUN 18 16  CREATININE 1.19 1.10  CALCIUM 7.9* 7.8*  MG 1.5* 2.4   GFR: Estimated Creatinine Clearance: 85.3 mL/min (by C-G formula based on Cr of 1.1). Liver Function Tests:  Recent Labs Lab 12/04/15 2110 12/05/15 0438  AST 36 31  ALT 38 37  ALKPHOS 34* 33*  BILITOT 1.3* 0.8  PROT 6.0* 5.9*  ALBUMIN 3.4* 3.2*   No results for input(s): LIPASE, AMYLASE in the last 168 hours. No results for input(s): AMMONIA in the last 168 hours. Coagulation Profile: No results for input(s): INR, PROTIME in the last 168 hours. Cardiac Enzymes: No results for input(s): CKTOTAL, CKMB, CKMBINDEX, TROPONINI in the last 168 hours. BNP (last 3 results) No results for input(s): PROBNP in the last 8760 hours. HbA1C: No results for input(s): HGBA1C in the last 72 hours. CBG:  Recent Labs Lab 12/05/15 0752 12/05/15 1233 12/05/15 1715 12/05/15 2005 12/06/15 0716  GLUCAP 94 93 111* 121* 90   Lipid Profile: No results for input(s): CHOL, HDL, LDLCALC, TRIG, CHOLHDL, LDLDIRECT in the last 72 hours. Thyroid Function Tests: No results for input(s): TSH, T4TOTAL, FREET4, T3FREE, THYROIDAB in the last 72 hours. Anemia Panel: No results for input(s): VITAMINB12, FOLATE, FERRITIN, TIBC, IRON, RETICCTPCT in the last 72 hours. Urine analysis:    Component Value Date/Time   COLORURINE  YELLOW 12/04/2015 2348   APPEARANCEUR CLEAR 12/04/2015 2348   LABSPEC 1.039* 12/04/2015 2348   PHURINE 5.0 12/04/2015 2348   GLUCOSEU >1000* 12/04/2015 2348   HGBUR TRACE* 12/04/2015 2348   BILIRUBINUR NEGATIVE 12/04/2015 2348   KETONESUR 40* 12/04/2015 2348   PROTEINUR NEGATIVE 12/04/2015 2348   NITRITE NEGATIVE 12/04/2015 2348   LEUKOCYTESUR NEGATIVE 12/04/2015 2348   Sepsis Labs: (procalcitonin:4,lacticidven:4)  )No results found for this or any previous visit (from the past 240 hour(s)).    Radiology Studies: Ct Chest W Contrast  12/05/2015  CLINICAL DATA:  Patient with history of diabetes. Infection of unknown etiology. EXAM: CT CHEST, ABDOMEN, AND PELVIS WITH CONTRAST TECHNIQUE: Multidetector CT imaging of the chest, abdomen and pelvis was performed following the standard protocol during bolus administration of intravenous contrast. CONTRAST:  ISOVUE-300 IOPAMIDOL (ISOVUE-300) INJECTION 61% COMPARISON:  Chest radiograph 11/26/2015. FINDINGS: CT CHEST Mediastinum/Nodes: Visualized thyroid is unremarkable. No enlarged axillary, mediastinal or hilar lymphadenopathy. Prominent sub cm mediastinal lymph nodes. Normal heart size.  No pericardial effusion. Aorta and main pulmonary artery are normal in caliber. Lungs/Pleura: Central airways are patent. Subpleural ground-glass and consolidative opacities are demonstrated within the bilateral lower lobes. No pleural effusion or pneumothorax. Small calcified granuloma right lower lobe. Musculoskeletal: No aggressive or acute appearing osseous lesions. CT ABDOMEN AND PELVIS Hepatobiliary: Multiple calcified granulomas are demonstrated within the liver. Gallbladder is unremarkable. No intrahepatic or extrahepatic biliary ductal dilatation. Pancreas: Unremarkable Spleen: Unremarkable Adrenals/Urinary Tract: The adrenal glands are normal. Kidneys enhance symmetrically with contrast. No hydronephrosis. Urinary bladder is unremarkable. There  is subtle fat stranding about the ureters bilaterally. Stomach/Bowel: Normal morphology of the stomach. No abnormal bowel wall thickening or evidence for bowel obstruction. The descending and sigmoid colon is decompressed. Vascular/Lymphatic: Normal caliber abdominal aorta. No retroperitoneal lymphadenopathy. Other: Prostate is unremarkable. Musculoskeletal: No aggressive or acute appearing osseous lesions. IMPRESSION: Nonspecific mild fat stranding about the ureters bilaterally. Recommend correlation with urinalysis to exclude the possibility of infection. The descending and sigmoid colon is decompressed limiting evaluation. Apparent wall thickening is likely secondary to decompression. Recommend clinical correlation for signs of colitis. Otherwise no acute process within the chest, abdomen or pelvis. Electronically Signed   By: Annia Belt M.D.   On: 12/05/2015 11:08   Ct Abdomen Pelvis W Contrast  12/05/2015  CLINICAL DATA:  Patient with history of diabetes. Infection of unknown etiology. EXAM: CT CHEST, ABDOMEN, AND PELVIS WITH CONTRAST TECHNIQUE: Multidetector CT imaging of the chest, abdomen and pelvis was performed following the standard protocol during bolus administration of intravenous contrast. CONTRAST:  ISOVUE-300 IOPAMIDOL (ISOVUE-300) INJECTION 61% COMPARISON:  Chest radiograph 11/26/2015. FINDINGS: CT CHEST Mediastinum/Nodes: Visualized thyroid is unremarkable. No enlarged axillary, mediastinal or hilar lymphadenopathy. Prominent sub cm mediastinal lymph nodes. Normal heart size. No pericardial effusion. Aorta and main pulmonary artery are normal in caliber. Lungs/Pleura: Central airways are patent. Subpleural ground-glass and consolidative opacities are demonstrated within the bilateral lower lobes. No pleural effusion or pneumothorax. Small calcified granuloma right lower lobe. Musculoskeletal: No aggressive or acute appearing osseous lesions. CT ABDOMEN AND PELVIS Hepatobiliary: Multiple  calcified granulomas are demonstrated within the liver. Gallbladder is unremarkable. No intrahepatic or extrahepatic biliary ductal dilatation. Pancreas: Unremarkable Spleen: Unremarkable Adrenals/Urinary Tract: The adrenal glands are normal. Kidneys enhance symmetrically with contrast. No hydronephrosis. Urinary bladder is unremarkable. There is subtle fat stranding about the ureters bilaterally. Stomach/Bowel: Normal morphology of the stomach. No abnormal bowel wall thickening or evidence for bowel obstruction. The descending and sigmoid colon is decompressed. Vascular/Lymphatic: Normal caliber abdominal aorta. No retroperitoneal lymphadenopathy. Other: Prostate is unremarkable. Musculoskeletal: No aggressive or acute appearing osseous lesions. IMPRESSION: Nonspecific mild fat stranding about the ureters bilaterally. Recommend correlation with urinalysis to exclude the possibility of infection. The descending and sigmoid colon is decompressed limiting evaluation. Apparent wall thickening is likely secondary to decompression. Recommend clinical correlation for signs of colitis. Otherwise no acute process within the chest, abdomen or pelvis. Electronically Signed   By: Annia Belt M.D.   On: 12/05/2015 11:08   Dg Chest Port 1 View  12/04/2015  CLINICAL DATA:  Acute onset of fever and lethargy. Hypotension. Initial encounter. EXAM: PORTABLE CHEST 1 VIEW COMPARISON:  Chest radiograph performed 12/05/2012 FINDINGS: The lungs are well-aerated. Pulmonary vascularity is at the upper limits of normal. There is no evidence of focal opacification, pleural effusion or pneumothorax. The cardiomediastinal silhouette is within normal limits. No acute osseous abnormalities are seen. IMPRESSION: No acute cardiopulmonary process seen. Electronically Signed   By: Roanna Raider  M.D.   On: 12/04/2015 21:57     Scheduled Meds: . insulin aspart  0-15 Units Subcutaneous TID WC  . insulin aspart  0-5 Units Subcutaneous QHS  .  insulin aspart  0-9 Units Subcutaneous TID WC  . insulin aspart  4 Units Subcutaneous TID WC   Continuous Infusions: . sodium chloride 50 mL/hr at 12/06/15 0521     LOS: 2 days   Time Spent in minutes   30 minutes  Timmi Devora D.O. on 12/06/2015 at 10:33 AM  Between 7am to 7pm - Pager - [phone]  After 7pm go to [email or web address] - password [email or web address]  And look for the night coverage person covering for me after hours  [facility] Office  [phone]

## 2015-12-07 LAB — HEMOGLOBIN A1C
HEMOGLOBIN A1C: 7.3 % — AB (ref 4.8–5.6)
MEAN PLASMA GLUCOSE: 163 mg/dL

## 2015-12-07 LAB — BASIC METABOLIC PANEL
Anion gap: 9 (ref 5–15)
BUN: 14 mg/dL (ref 6–20)
CHLORIDE: 110 mmol/L (ref 101–111)
CO2: 23 mmol/L (ref 22–32)
CREATININE: 0.98 mg/dL (ref 0.61–1.24)
Calcium: 8.6 mg/dL — ABNORMAL LOW (ref 8.9–10.3)
GFR calc Af Amer: >60 mL/min (ref >60)
GFR calc non Af Amer: >60 mL/min (ref >60)
Glucose, Bld: 142 mg/dL — ABNORMAL HIGH (ref 65–99)
Potassium: 3.8 mmol/L (ref 3.5–5.1)
SODIUM: 142 mmol/L (ref 135–145)

## 2015-12-07 LAB — CBC
HEMATOCRIT: 36.6 % — AB (ref 39.0–52.0)
HEMOGLOBIN: 13 g/dL (ref 13.0–17.0)
MCH: 31 pg (ref 26.0–34.0)
MCHC: 35.5 g/dL (ref 30.0–36.0)
MCV: 87.4 fL (ref 78.0–100.0)
Platelets: 87 10*3/uL — ABNORMAL LOW (ref 150–400)
RBC: 4.19 MIL/uL — ABNORMAL LOW (ref 4.22–5.81)
RDW: 14.8 % (ref 11.5–15.5)
WBC: 4 10*3/uL (ref 4.0–10.5)

## 2015-12-07 LAB — PATHOLOGIST SMEAR REVIEW

## 2015-12-07 LAB — GLUCOSE, CAPILLARY: Glucose-Capillary: 116 mg/dL — ABNORMAL HIGH (ref 65–99)

## 2015-12-07 NOTE — Care Management Note (Signed)
Case Management Note  Patient Details  Name: Marc Clark MRN: [id number] Date of Birth: 1961/03/20  Subjective/Objective:   55 y/o m admitted w/SIRS. From home.                 Action/Plan:d/c home no needs or orders.   Expected Discharge Date:                  Expected Discharge Plan:  Home/Self Care  In-House Referral:     Discharge planning Services  CM Consult  Post Acute Care Choice:    Choice offered to:     DME Arranged:    DME Agency:     HH Arranged:    HH Agency:     Status of Service:  Completed, signed off  Medicare Important Message Given:    Date Medicare IM Given:    Medicare IM give by:    Date Additional Medicare IM Given:    Additional Medicare Important Message give by:     If discussed at Long Length of Stay Meetings, dates discussed:    Additional Comments:  Lanier ClamMahabir, Kassem Kibbe, RN 12/07/2015, 10:28 AM

## 2015-12-07 NOTE — Discharge Summary (Signed)
Physician Discharge Summary  Marc Clark ZOX:[id number] DOB: 10-May-1961 DOA: 12/04/2015  PCP: No primary care provider on file.  Admit date: 12/04/2015 Discharge date: 12/07/2015  Time spent: 45 minutes  Recommendations for Outpatient Follow-up:  Patient will be discharged to home.  Patient will need to follow up with primary care provider within one week of discharge.  Follow up with Dr. Pamelia Hoit in 2 weeks.  Repeat CBC with differential in 2 weeks. Patient should continue medications as prescribed.  Patient should follow a carb modified diet.   Discharge Diagnoses:  Fever/SIRS/pancytopenia Hypertension Diabetes mellitus, type II  Discharge Condition: Stable   Diet recommendation: Carb modified  Filed Weights   12/05/15 0029 12/06/15 0500 12/07/15 0456  Weight: 86.5 kg (190 lb 11.2 oz) 85.9 kg (189 lb 6 oz) 86 kg (189 lb 9.5 oz)    History of present illness:  on 12/05/2015 by Dr. Midge Minium Marc Clark is a 55 y.o. male with medical history significant of with history of diabetes mellitus type 2 who was recently started on Jardiance 3 weeks ago and until then patient was on metformin presents to the ER because of increasing weakness with fever and chills over the last 4 days. Patient denies any shortness of breath productive cough nausea vomiting abdominal pain diarrhea or any dysuria. Patient is initially hypotensive in the ER and was given 1 L fluid bolus followed which patient's blood pressure improved more than 100 systolic. The patient's temperature was around 102F on presentation. Chest x-ray and UA are unremarkable. Patient's blood work showed pancytopenia. Patient denies any recent travel or sick contacts.  Hospital Course:  Fevers/SIRS (systemic inflammatory response syndrome) (HCC)/ Pancytopenia (HCC): -Started on empiric antibiotics vancomycin and Zosyn on admission -UA shows, 6-30 white blood cells  -Chest x-ray did not show any infiltrates -HIV  nonreactive -Blood cultures and urine culture show no growth to date -Antibiotics held -CT chest, abdomen and pelvis: Nonspecific mild fat stranding about the ureters bilaterally, descending and sigmoid colon thickening question colitis. No acute process within the chest, abdomen or pelvis -?Viral cause -Spoke with Dr. Pamelia Hoit, hem/onc, via phone.  Recommended outpatient follow up with repeat labs in 2 weeks.  -Hepatitis panel pending -hemoglobin and platelets improving.  Drop likely due to viral etiology and bone marrow suppression secondary to viral cause.  Hypotension -Resolved with IV fluid hydration.  Diabetes mellitus type 2, controlled (HCC): -metformin held, but may resume at discharge -Hemoglobin A1c pending  Consultants Dr. Pamelia Hoit, hem/onc  Procedures  None  Discharge Exam: Filed Vitals:   12/06/15 1355 12/07/15 0356  BP: 102/61 98/53  Pulse: 66 67  Temp: 98 F (36.7 C) 97.9 F (36.6 C)  Resp: 20 20    Exam  General: Well developed, well nourished, NAD  HEENT: NCAT, mucous membranes moist.   Neck: Supple, no JVD, no masses  Cardiovascular: S1 S2 auscultated, no murmurs, RRR  Respiratory: Clear to auscultation bilaterally  Abdomen: Soft, nontender, nondistended, + bowel sounds  Extremities: warm dry without cyanosis clubbing or edema  Neuro: AAOx3, nonfocal  Psych: Normal affect and demeanor   Discharge Instructions      Discharge Instructions    Discharge instructions    Complete by:  As directed   Patient will be discharged to home.  Patient will need to follow up with primary care provider within one week of discharge.  Follow up with Dr. Pamelia Hoit in 2 weeks.  Repeat CBC with differential in 2 weeks. Patient should continue medications as prescribed.  Patient should follow a carb modified diet.            Medication List    TAKE these medications        acetaminophen 500 MG tablet  Commonly known as:  TYLENOL  Take 1,000 mg by mouth  every 6 (six) hours as needed for moderate pain.     metFORMIN 500 MG tablet  Commonly known as:  GLUCOPHAGE  Take 500 mg by mouth 2 (two) times daily with a meal.     multivitamin with minerals Tabs tablet  Take 1 tablet by mouth daily.     PRESCRIPTION MEDICATION  Take 1 tablet by mouth daily. For diabetes       No Known Allergies Follow-up Information    Follow up with Primary care physician. Schedule an appointment as soon as possible for a visit in 1 week.      Follow up with Sabas Sous, MD. Schedule an appointment as soon as possible for a visit in 2 weeks.   Specialty:  Hematology and Oncology   Why:  Hospital follow up.    Contact information:   [address] [place] [address] [phone]        The results of significant diagnostics from this hospitalization (including imaging, microbiology, ancillary and laboratory) are listed below for reference.    Significant Diagnostic Studies: Ct Chest W Contrast  12/05/2015  CLINICAL DATA:  Patient with history of diabetes. Infection of unknown etiology. EXAM: CT CHEST, ABDOMEN, AND PELVIS WITH CONTRAST TECHNIQUE: Multidetector CT imaging of the chest, abdomen and pelvis was performed following the standard protocol during bolus administration of intravenous contrast. CONTRAST:  ISOVUE-300 IOPAMIDOL (ISOVUE-300) INJECTION 61% COMPARISON:  Chest radiograph 11/26/2015. FINDINGS: CT CHEST Mediastinum/Nodes: Visualized thyroid is unremarkable. No enlarged axillary, mediastinal or hilar lymphadenopathy. Prominent sub cm mediastinal lymph nodes. Normal heart size. No pericardial effusion. Aorta and main pulmonary artery are normal in caliber. Lungs/Pleura: Central airways are patent. Subpleural ground-glass and consolidative opacities are demonstrated within the bilateral lower lobes. No pleural effusion or pneumothorax. Small calcified granuloma right lower lobe. Musculoskeletal: No aggressive or acute appearing  osseous lesions. CT ABDOMEN AND PELVIS Hepatobiliary: Multiple calcified granulomas are demonstrated within the liver. Gallbladder is unremarkable. No intrahepatic or extrahepatic biliary ductal dilatation. Pancreas: Unremarkable Spleen: Unremarkable Adrenals/Urinary Tract: The adrenal glands are normal. Kidneys enhance symmetrically with contrast. No hydronephrosis. Urinary bladder is unremarkable. There is subtle fat stranding about the ureters bilaterally. Stomach/Bowel: Normal morphology of the stomach. No abnormal bowel wall thickening or evidence for bowel obstruction. The descending and sigmoid colon is decompressed. Vascular/Lymphatic: Normal caliber abdominal aorta. No retroperitoneal lymphadenopathy. Other: Prostate is unremarkable. Musculoskeletal: No aggressive or acute appearing osseous lesions. IMPRESSION: Nonspecific mild fat stranding about the ureters bilaterally. Recommend correlation with urinalysis to exclude the possibility of infection. The descending and sigmoid colon is decompressed limiting evaluation. Apparent wall thickening is likely secondary to decompression. Recommend clinical correlation for signs of colitis. Otherwise no acute process within the chest, abdomen or pelvis. Electronically Signed   By: Annia Belt M.D.   On: 12/05/2015 11:08   Ct Abdomen Pelvis W Contrast  12/05/2015  CLINICAL DATA:  Patient with history of diabetes. Infection of unknown etiology. EXAM: CT CHEST, ABDOMEN, AND PELVIS WITH CONTRAST TECHNIQUE: Multidetector CT imaging of the chest, abdomen and pelvis was performed following the standard protocol during bolus administration of intravenous contrast. CONTRAST:  ISOVUE-300 IOPAMIDOL (ISOVUE-300) INJECTION 61% COMPARISON:  Chest radiograph 11/26/2015.  FINDINGS: CT CHEST Mediastinum/Nodes: Visualized thyroid is unremarkable. No enlarged axillary, mediastinal or hilar lymphadenopathy. Prominent sub cm mediastinal lymph nodes. Normal heart size. No  pericardial effusion. Aorta and main pulmonary artery are normal in caliber. Lungs/Pleura: Central airways are patent. Subpleural ground-glass and consolidative opacities are demonstrated within the bilateral lower lobes. No pleural effusion or pneumothorax. Small calcified granuloma right lower lobe. Musculoskeletal: No aggressive or acute appearing osseous lesions. CT ABDOMEN AND PELVIS Hepatobiliary: Multiple calcified granulomas are demonstrated within the liver. Gallbladder is unremarkable. No intrahepatic or extrahepatic biliary ductal dilatation. Pancreas: Unremarkable Spleen: Unremarkable Adrenals/Urinary Tract: The adrenal glands are normal. Kidneys enhance symmetrically with contrast. No hydronephrosis. Urinary bladder is unremarkable. There is subtle fat stranding about the ureters bilaterally. Stomach/Bowel: Normal morphology of the stomach. No abnormal bowel wall thickening or evidence for bowel obstruction. The descending and sigmoid colon is decompressed. Vascular/Lymphatic: Normal caliber abdominal aorta. No retroperitoneal lymphadenopathy. Other: Prostate is unremarkable. Musculoskeletal: No aggressive or acute appearing osseous lesions. IMPRESSION: Nonspecific mild fat stranding about the ureters bilaterally. Recommend correlation with urinalysis to exclude the possibility of infection. The descending and sigmoid colon is decompressed limiting evaluation. Apparent wall thickening is likely secondary to decompression. Recommend clinical correlation for signs of colitis. Otherwise no acute process within the chest, abdomen or pelvis. Electronically Signed   By: Annia Belt M.D.   On: 12/05/2015 11:08   Dg Chest Port 1 View  12/04/2015  CLINICAL DATA:  Acute onset of fever and lethargy. Hypotension. Initial encounter. EXAM: PORTABLE CHEST 1 VIEW COMPARISON:  Chest radiograph performed 12/05/2012 FINDINGS: The lungs are well-aerated. Pulmonary vascularity is at the upper limits of normal. There is  no evidence of focal opacification, pleural effusion or pneumothorax. The cardiomediastinal silhouette is within normal limits. No acute osseous abnormalities are seen. IMPRESSION: No acute cardiopulmonary process seen. Electronically Signed   By: Roanna Raider M.D.   On: 12/04/2015 21:57    Microbiology: Recent Results (from the past 240 hour(s))  Blood Culture (routine x 2)     Status: None (Preliminary result)   Collection Time: 12/04/15  9:59 PM  Result Value Ref Range Status   Specimen Description LEFT ANTECUBITAL  Final   Special Requests BOTTLES DRAWN AEROBIC AND ANAEROBIC 5CC  Final   Culture   Final    NO GROWTH 1 DAY Performed at [facility]    Report Status PENDING  Incomplete  Blood Culture (routine x 2)     Status: None (Preliminary result)   Collection Time: 12/04/15 10:03 PM  Result Value Ref Range Status   Specimen Description BLOOD LEFT HAND  Final   Special Requests BOTTLES DRAWN AEROBIC AND ANAEROBIC 5CC  Final   Culture   Final    NO GROWTH 1 DAY Performed at [facility]    Report Status PENDING  Incomplete  Urine culture     Status: None   Collection Time: 12/04/15 11:48 PM  Result Value Ref Range Status   Specimen Description URINE, CLEAN CATCH  Final   Special Requests NONE  Final   Culture   Final    NO GROWTH 1 DAY Performed at [facility]    Report Status 12/06/2015 FINAL  Final     Labs: Basic Metabolic Panel:  Recent Labs Lab 12/04/15 2110 12/05/15 0438 12/07/15 0500  NA 137 142 142  K 2.9* 4.3 3.8  CL 107 112* 110  CO2 19* 23 23  GLUCOSE 167* 158* 142*  BUN 18 16  14  CREATININE 1.19 1.10 0.98  CALCIUM 7.9* 7.8* 8.6*  MG 1.5* 2.4  --    Liver Function Tests:  Recent Labs Lab 12/04/15 2110 12/05/15 0438  AST 36 31  ALT 38 37  ALKPHOS 34* 33*  BILITOT 1.3* 0.8  PROT 6.0* 5.9*  ALBUMIN 3.4* 3.2*   No results for input(s): LIPASE, AMYLASE in the last 168 hours. No results for input(s): AMMONIA in the  last 168 hours. CBC:  Recent Labs Lab 12/04/15 2110 12/05/15 0438 12/07/15 0500  WBC 3.0* 4.3 4.0  NEUTROABS 2.1 2.5  --   HGB 12.5* 11.9* 13.0  HCT 35.4* 34.3* 36.6*  MCV 89.6 87.7 87.4  PLT 57* 52* 87*   Cardiac Enzymes: No results for input(s): CKTOTAL, CKMB, CKMBINDEX, TROPONINI in the last 168 hours. BNP: BNP (last 3 results) No results for input(s): BNP in the last 8760 hours.  ProBNP (last 3 results) No results for input(s): PROBNP in the last 8760 hours.  CBG:  Recent Labs Lab 12/06/15 0716 12/06/15 1130 12/06/15 1654 12/06/15 2217 12/07/15 0745  GLUCAP 90 150* 103* 160* 116*       Signed:  Florena Kozma  [facility] 12/07/2015, 9:45 AM

## 2015-12-07 NOTE — Discharge Instructions (Signed)
Thrombocytopenia °Thrombocytopenia is a condition in which there is an abnormally small number of platelets in your blood. Platelets are also called thrombocytes. Platelets are needed for blood clotting. °CAUSES °Thrombocytopenia is caused by:  °· Decreased production of platelets. This can be caused by: °¨ Aplastic anemia in which your bone marrow quits making blood cells. °¨ Cancer in the bone marrow. °¨ Use of certain medicines, including chemotherapy. °¨ Infection in the bone marrow. °¨ Heavy alcohol consumption. °· Increased destruction of platelets. This can be caused by: °¨ Certain immune diseases. °¨ Use of certain drugs. °¨ Certain blood clotting disorders. °¨ Certain inherited disorders. °¨ Certain bleeding disorders. °¨ Pregnancy. °· Having an enlarged spleen (hypersplenism). In hypersplenism, the spleen gathers up platelets from circulation. This means the platelets are not available to help with blood clotting. The spleen can enlarge due to cirrhosis or other conditions. °SYMPTOMS  °The symptoms of thrombocytopenia are side effects of poor blood clotting. Some of these are: °· Abnormal bleeding. °· Nosebleeds. °· Heavy menstrual periods. °· Blood in the urine or stools. °· Purpura. This is a purplish discoloration in the skin produced by small bleeding vessels near the surface of the skin. °· Bruising. °· A rash that may be petechial. This looks like pinpoint, purplish-red spots on the skin and mucous membranes. It is caused by bleeding from small blood vessels (capillaries). °DIAGNOSIS  °Your caregiver will make this diagnosis based on your exam and blood tests. Sometimes, a bone marrow study is done to look for the original cells (megakaryocytes) that make platelets. °TREATMENT  °Treatment depends on the cause of the condition. °· Medicines may be given to help protect your platelets from being destroyed. °· In some cases, a replacement (transfusion) of platelets may be required to stop or prevent  bleeding. °· Sometimes, the spleen must be surgically removed. °HOME CARE INSTRUCTIONS  °· Check the skin and linings inside your mouth for bruising or bleeding as directed by your caregiver. °· Check your sputum, urine, and stool for blood as directed by your caregiver. °· Do not return to any activities that could cause bumps or bruises until your caregiver says it is okay. °· Take extra care not to cut yourself when shaving or when using scissors, needles, knives, and other tools. °· Take extra care not to burn yourself when ironing or cooking. °· Ask your caregiver if it is okay for you to drink alcohol. °· Only take over-the-counter or prescription medicines as directed by your caregiver. °· Notify all your caregivers, including dentists and eye doctors, about your condition. °SEEK IMMEDIATE MEDICAL CARE IF:  °· You develop active bleeding from anywhere in your body. °· You develop unexplained bruising or bleeding. °· You have blood in your sputum, urine, or stool. °MAKE SURE YOU: °· Understand these instructions. °· Will watch your condition. °· Will get help right away if you are not doing well or get worse. °  °This information is not intended to replace advice given to you by your health care provider. Make sure you discuss any questions you have with your health care provider. °  °Document Released: 07/25/2005 Document Revised: 10/17/2011 Document Reviewed: 01/26/2015 °Elsevier Interactive Patient Education ©2016 [employer] ° °

## 2015-12-08 DIAGNOSIS — B9789 Other viral agents as the cause of diseases classified elsewhere: Secondary | ICD-10-CM | POA: Diagnosis not present

## 2015-12-08 DIAGNOSIS — E784 Other hyperlipidemia: Secondary | ICD-10-CM | POA: Diagnosis not present

## 2015-12-08 DIAGNOSIS — E119 Type 2 diabetes mellitus without complications: Secondary | ICD-10-CM | POA: Diagnosis not present

## 2015-12-08 LAB — HEPATITIS PANEL, ACUTE
HEP A IGM: NEGATIVE
HEP B S AG: POSITIVE — AB
Hep B C IgM: NEGATIVE

## 2015-12-09 ENCOUNTER — Telehealth: Payer: Self-pay | Admitting: Hematology and Oncology

## 2015-12-09 NOTE — Telephone Encounter (Signed)
[facility] for hospital follow up visit this week. Not able to reach patient by phone - left message asking that patient call re scheduling a hospital follow up appointment with Dr. Pamelia HoitGudena. My name and direct number was left for patient to call me back directly.

## 2015-12-10 LAB — CULTURE, BLOOD (ROUTINE X 2)
Culture: NO GROWTH
Culture: NO GROWTH

## 2015-12-17 ENCOUNTER — Telehealth: Payer: Self-pay | Admitting: Hematology and Oncology

## 2015-12-17 NOTE — Telephone Encounter (Signed)
Spoke with patient re hosp f/u appointment with Dr. Pamelia HoitGudena 5/16 @ 3:45 pm.  Patient will arrive at 3:30 pm for 3:45 pm f/u - per patient cannot be here before 3:30 pm. Appointment scheduled per staff message from [facility].

## 2015-12-22 ENCOUNTER — Encounter: Payer: Self-pay | Admitting: Hematology and Oncology

## 2015-12-22 ENCOUNTER — Ambulatory Visit (HOSPITAL_BASED_OUTPATIENT_CLINIC_OR_DEPARTMENT_OTHER): Payer: BLUE CROSS/BLUE SHIELD | Admitting: Hematology and Oncology

## 2015-12-22 ENCOUNTER — Ambulatory Visit (HOSPITAL_BASED_OUTPATIENT_CLINIC_OR_DEPARTMENT_OTHER): Payer: BLUE CROSS/BLUE SHIELD

## 2015-12-22 VITALS — BP 109/65 | HR 78 | Temp 98.5°F | Resp 18 | Ht 69.0 in | Wt 183.1 lb

## 2015-12-22 DIAGNOSIS — D61818 Other pancytopenia: Secondary | ICD-10-CM

## 2015-12-22 DIAGNOSIS — E119 Type 2 diabetes mellitus without complications: Secondary | ICD-10-CM | POA: Diagnosis not present

## 2015-12-22 DIAGNOSIS — B1910 Unspecified viral hepatitis B without hepatic coma: Secondary | ICD-10-CM

## 2015-12-22 LAB — COMPREHENSIVE METABOLIC PANEL
ALT: 28 U/L (ref 0–55)
AST: 19 U/L (ref 5–34)
Albumin: 4.2 g/dL (ref 3.5–5.0)
Alkaline Phosphatase: 37 U/L — ABNORMAL LOW (ref 40–150)
Anion Gap: 7 mEq/L (ref 3–11)
BUN: 16.5 mg/dL (ref 7.0–26.0)
CHLORIDE: 104 meq/L (ref 98–109)
CO2: 28 mEq/L (ref 22–29)
Calcium: 9.5 mg/dL (ref 8.4–10.4)
Creatinine: 1.3 mg/dL (ref 0.7–1.3)
EGFR: 71 mL/min/{1.73_m2} — ABNORMAL LOW (ref >90)
GLUCOSE: 239 mg/dL — AB (ref 70–140)
POTASSIUM: 4 meq/L (ref 3.5–5.1)
SODIUM: 139 meq/L (ref 136–145)
Total Bilirubin: <0.3 mg/dL (ref 0.20–1.20)
Total Protein: 7.6 g/dL (ref 6.4–8.3)

## 2015-12-22 LAB — CBC & DIFF AND RETIC
BASO%: 0.5 % (ref 0.0–2.0)
Basophils Absolute: 0 10*3/uL (ref 0.0–0.1)
EOS ABS: 0.1 10*3/uL (ref 0.0–0.5)
EOS%: 1.3 % (ref 0.0–7.0)
HCT: 40.3 % (ref 38.4–49.9)
HGB: 13.7 g/dL (ref 13.0–17.1)
Immature Retic Fract: 7.9 % (ref 3.00–10.60)
LYMPH#: 2.2 10*3/uL (ref 0.9–3.3)
LYMPH%: 53.8 % — ABNORMAL HIGH (ref 14.0–49.0)
MCH: 31.3 pg (ref 27.2–33.4)
MCHC: 34 g/dL (ref 32.0–36.0)
MCV: 92 fL (ref 79.3–98.0)
MONO#: 0.3 10*3/uL (ref 0.1–0.9)
MONO%: 7 % (ref 0.0–14.0)
NEUT%: 37.4 % — ABNORMAL LOW (ref 39.0–75.0)
NEUTROS ABS: 1.5 10*3/uL (ref 1.5–6.5)
Platelets: 276 10*3/uL (ref 140–400)
RBC: 4.38 10*6/uL (ref 4.20–5.82)
RDW: 14.4 % (ref 11.0–14.6)
RETIC %: 1.87 % — AB (ref 0.80–1.80)
Retic Ct Abs: 81.91 10*3/uL (ref 34.80–93.90)
WBC: 4 10*3/uL (ref 4.0–10.3)

## 2015-12-22 NOTE — Progress Notes (Signed)
[facility] CONSULT NOTE  No care team member to display  CHIEF COMPLAINTS/PURPOSE OF CONSULTATION:  Thrombocytopenia and leukopenia  HISTORY OF PRESENTING ILLNESS:  Marc Clark 55 y.o. male is here because of recent diagnosis of thrombocytopenia. Patient was hospitalized with fever and systemic inflammatory response syndrome with initial CBC revealing decrease in the white blood cell count and platelet count. His WBC count was 3.0 and platelets are 57and 11/26/2015. At the time of discharge and 12/07/2015, his white count has normalized to 4 and hemoglobin of 13 with a platelet count of 87. I discussed with the hospitalist when they admitted him. All his blood cultures and urine cultures had come back negative.I suggested a 2 week follow-up and so he is here in our office. I also suggested obtaining hepatitis B and HIV testing. It turns out that he is hepatitis B positive and HIV negative.He feels well today. He denies any bruising or bleeding.  MEDICAL HISTORY:  Past Medical History  Diagnosis Date  . Diabetes mellitus type 2 in nonobese (HCC)   . Diabetes mellitus type 2, controlled (HCC) 12/05/2015    SURGICAL HISTORY: No past surgical history on file.  SOCIAL HISTORY: Social History   Social History  . Marital Status: Married    Spouse Name: N/A  . Number of Children: N/A  . Years of Education: N/A   Occupational History  . Not on file.   Social History Main Topics  . Smoking status: Never Smoker   . Smokeless tobacco: Not on file  . Alcohol Use: No  . Drug Use: Not on file  . Sexual Activity: Not on file   Other Topics Concern  . Not on file   Social History Narrative    FAMILY HISTORY: Family History  Problem Relation Age of Onset  . Diabetes Mellitus II Mother     ALLERGIES:  has No Known Allergies.  MEDICATIONS:  Current Outpatient Prescriptions  Medication Sig Dispense Refill  . acetaminophen (TYLENOL) 500 MG tablet Take 1,000 mg by  mouth every 6 (six) hours as needed for moderate pain.    . metFORMIN (GLUCOPHAGE) 500 MG tablet Take 500 mg by mouth 2 (two) times daily with a meal.    . Multiple Vitamin (MULTIVITAMIN WITH MINERALS) TABS tablet Take 1 tablet by mouth daily.    Marland Kitchen PRESCRIPTION MEDICATION Take 1 tablet by mouth daily. For diabetes     No current facility-administered medications for this visit.    REVIEW OF SYSTEMS:   Constitutional: Denies fevers, chills or abnormal night sweats Eyes: Denies blurriness of vision, double vision or watery eyes Ears, nose, mouth, throat, and face: Denies mucositis or sore throat Respiratory: Denies cough, dyspnea or wheezes Cardiovascular: Denies palpitation, chest discomfort or lower extremity swelling Gastrointestinal:  Denies nausea, heartburn or change in bowel habits Skin: Denies abnormal skin rashes Lymphatics: Denies new lymphadenopathy or easy bruising Neurological:Denies numbness, tingling or new weaknesses Behavioral/Psych: Mood is stable, no new changes   All other systems were reviewed with the patient and are negative.  PHYSICAL EXAMINATION: ECOG PERFORMANCE STATUS: 1 - Symptomatic but completely ambulatory  Filed Vitals:   12/22/15 1526  BP: 109/65  Pulse: 78  Temp: 98.5 F (36.9 C)  Resp: 18   Filed Weights   12/22/15 1526  Weight: 183 lb 1.6 oz (83.054 kg)    GENERAL:alert, no distress and comfortable SKIN: skin color, texture, turgor are normal, no rashes or significant lesions EYES: normal, conjunctiva are pink and non-injected, sclera  clear OROPHARYNX:no exudate, no erythema and lips, buccal mucosa, and tongue normal  NECK: supple, thyroid normal size, non-tender, without nodularity LYMPH:  no palpable lymphadenopathy in the cervical, axillary or inguinal LUNGS: clear to auscultation and percussion with normal breathing effort HEART: regular rate & rhythm and no murmurs and no lower extremity edema ABDOMEN:abdomen soft, non-tender and  normal bowel sounds, no hepatosplenomegaly Musculoskeletal:no cyanosis of digits and no clubbing  PSYCH: alert & oriented x 3 with fluent speech NEURO: no focal motor/sensory deficits   LABORATORY DATA:  I have reviewed the data as listed Lab Results  Component Value Date   WBC 4.0 12/07/2015   HGB 13.0 12/07/2015   HCT 36.6* 12/07/2015   MCV 87.4 12/07/2015   PLT 87* 12/07/2015   Lab Results  Component Value Date   NA 142 12/07/2015   K 3.8 12/07/2015   CL 110 12/07/2015   CO2 23 12/07/2015    RADIOGRAPHIC STUDIES: I have personally reviewed the radiological reports and agreed with the findings in the report.  ASSESSMENT AND PLAN:  Pancytopenia (HCC) Hospitalization for SIRS and was found to have leukopenia and thrombocytopenia. Most likely etiology is viral bone marrow suppression. Workup also revealed hepatitis B surface antigen positivity. Clinically no evidence of hepatomegaly or splenomegaly or lymphadenopathy.  Plan: Obtain CBC with differential today. I will send a referral to gastroenterology for hepatitis B positivity. Uptain CBC records from his primary care physician.  If his CBC shows a normal platelet count and I do not need to see him.If however it shows decrease in the platelet count then I would probably follow him with watchful monitoring in 3-4 months.  All questions were answered. The patient knows to call the clinic with any problems, questions or concerns.    Sabas SousGudena, Amarisa Wilinski K, MD 12/22/2015

## 2015-12-22 NOTE — Assessment & Plan Note (Signed)
Hospitalization for SIRS and was found to have leukopenia and thrombocytopenia. Most likely etiology is viral bone marrow suppression. Workup also revealed hepatitis B surface antigen positivity. Clinically no evidence of hepatomegaly or splenomegaly or lymphadenopathy.  Plan: Obtain CBC with differential today. I will send a referral to gastroenterology for hepatitis B positivity. Uptain CBC records from his primary care physician.  If his CBC shows a normal platelet count and I do not need to see him.If however it shows decrease in the platelet count then I would probably follow him with watchful monitoring in 3-4 months.

## 2015-12-23 ENCOUNTER — Telehealth: Payer: Self-pay | Admitting: Hematology and Oncology

## 2015-12-23 LAB — HEPATITIS B CORE ANTIBODY, IGM: HEP B C IGM: NEGATIVE

## 2015-12-23 LAB — HEPATITIS B SURFACE ANTIBODY,QUALITATIVE: HEP B SURFACE AB, QUAL: NONREACTIVE

## 2015-12-23 LAB — HEPATITIS B DNA, ULTRAQUANTITATIVE, PCR: HBV DNA SERPL PCR-LOG IU: 3.272 {Log_IU}/mL

## 2015-12-23 LAB — HEPATITIS B CORE ANTIBODY, TOTAL: HEP B C TOTAL AB: POSITIVE — AB

## 2015-12-23 NOTE — Telephone Encounter (Signed)
Dawn, NP at [facility] left message on voicemail re referral. Per Dawn they are going to see patient, however she has some questions re information within the office note re TB that she would like to discuss with [facility] prior to bringing patient in to office for a visit. Message forwarded to desk nurse - I also spoke with nurse re above.

## 2015-12-23 NOTE — Telephone Encounter (Signed)
Patient sent back to lab yesterday and informed he would be contacted re GI appointment. No f/u up given/ordered at this time. Per 5/16 pof patient to be seen [place] [facility] re Hep B positivity. Called both [facility] who informed me that they do not treat Hep B positivity. Per [facility] patients positive for Hep B are being [facility] - [phone]. Spoke with Lorica at [facility] (liver specialist) today and per Westly Pam[place]orica they [place] and seeing patients positive for Hep B. Per Lorica complete and return referral that she is sending to me - return with pertinent information and they will set up appointment and contact patient.

## 2015-12-23 NOTE — Telephone Encounter (Signed)
[facility] Completed and faxed referral today with office note - labs - demographics and insurance. Per Westly PamLorica they will schedule and contact patient. VG informed  [facility] [place] [facility] - [phone] - phone [phone] - fax

## 2015-12-24 ENCOUNTER — Telehealth: Payer: Self-pay

## 2015-12-24 NOTE — Telephone Encounter (Signed)
Received VM regarding referral for Hep B positivity.  Call is from BigelowDawn, NP [facility].  Dawn, NP stated in her msg she was concerned with a granuloma present on a CTwhich pt received while hospitalized.  Dawn, NP states her concerns are for TB and potential exposure.  This information as well as CT results reviewed with Dr. Pamelia HoitGudena, MD.  Per Dr. Pamelia HoitGudena, pt seen in our office for hospital f/u in regards to thrombocytopenia which has since resolved as seen in last CBC.  Dr. Pamelia HoitGudena unaware of previous history of TB and denies pt exhibiting any signs or symptoms of active TB during evaluation.  This RN returned call to [facility] with all of this information and our contact information should she have any further questions.

## 2015-12-25 DIAGNOSIS — E784 Other hyperlipidemia: Secondary | ICD-10-CM | POA: Diagnosis not present

## 2015-12-25 DIAGNOSIS — E119 Type 2 diabetes mellitus without complications: Secondary | ICD-10-CM | POA: Diagnosis not present

## 2015-12-27 NOTE — Telephone Encounter (Signed)
[facility]. [facility]30 [facility]. [facility]

## 2016-01-06 ENCOUNTER — Other Ambulatory Visit (HOSPITAL_COMMUNITY): Payer: Self-pay | Admitting: Nurse Practitioner

## 2016-01-06 DIAGNOSIS — B182 Chronic viral hepatitis C: Secondary | ICD-10-CM

## 2016-01-06 DIAGNOSIS — B181 Chronic viral hepatitis B without delta-agent: Secondary | ICD-10-CM | POA: Diagnosis not present

## 2016-02-04 ENCOUNTER — Ambulatory Visit (HOSPITAL_COMMUNITY)
Admission: RE | Admit: 2016-02-04 | Discharge: 2016-02-04 | Disposition: A | Discharge status: Home / Self Care | Payer: BLUE CROSS/BLUE SHIELD | Source: Ambulatory Visit | Attending: Nurse Practitioner | Admitting: Nurse Practitioner

## 2016-02-04 DIAGNOSIS — B182 Chronic viral hepatitis C: Secondary | ICD-10-CM | POA: Diagnosis not present

## 2016-04-20 DIAGNOSIS — E119 Type 2 diabetes mellitus without complications: Secondary | ICD-10-CM | POA: Diagnosis not present

## 2016-04-20 DIAGNOSIS — M545 Low back pain: Secondary | ICD-10-CM | POA: Diagnosis not present

## 2016-04-20 DIAGNOSIS — E784 Other hyperlipidemia: Secondary | ICD-10-CM | POA: Diagnosis not present

## 2016-11-02 DIAGNOSIS — E119 Type 2 diabetes mellitus without complications: Secondary | ICD-10-CM | POA: Diagnosis not present

## 2016-11-02 DIAGNOSIS — Z125 Encounter for screening for malignant neoplasm of prostate: Secondary | ICD-10-CM | POA: Diagnosis not present

## 2016-11-02 DIAGNOSIS — E784 Other hyperlipidemia: Secondary | ICD-10-CM | POA: Diagnosis not present

## 2016-11-02 DIAGNOSIS — J302 Other seasonal allergic rhinitis: Secondary | ICD-10-CM | POA: Diagnosis not present

## 2016-11-02 DIAGNOSIS — M545 Low back pain: Secondary | ICD-10-CM | POA: Diagnosis not present

## 2016-11-11 DIAGNOSIS — H2513 Age-related nuclear cataract, bilateral: Secondary | ICD-10-CM | POA: Diagnosis not present

## 2016-11-11 DIAGNOSIS — E119 Type 2 diabetes mellitus without complications: Secondary | ICD-10-CM | POA: Diagnosis not present

## 2016-11-11 DIAGNOSIS — H25013 Cortical age-related cataract, bilateral: Secondary | ICD-10-CM | POA: Diagnosis not present

## 2017-02-01 DIAGNOSIS — E784 Other hyperlipidemia: Secondary | ICD-10-CM | POA: Diagnosis not present

## 2017-02-01 DIAGNOSIS — E119 Type 2 diabetes mellitus without complications: Secondary | ICD-10-CM | POA: Diagnosis not present

## 2017-05-31 DIAGNOSIS — J069 Acute upper respiratory infection, unspecified: Secondary | ICD-10-CM | POA: Diagnosis not present

## 2017-05-31 DIAGNOSIS — E7849 Other hyperlipidemia: Secondary | ICD-10-CM | POA: Diagnosis not present

## 2017-05-31 DIAGNOSIS — Z23 Encounter for immunization: Secondary | ICD-10-CM | POA: Diagnosis not present

## 2017-05-31 DIAGNOSIS — E119 Type 2 diabetes mellitus without complications: Secondary | ICD-10-CM | POA: Diagnosis not present

## 2017-09-01 DIAGNOSIS — E7849 Other hyperlipidemia: Secondary | ICD-10-CM | POA: Diagnosis not present

## 2017-09-01 DIAGNOSIS — Z125 Encounter for screening for malignant neoplasm of prostate: Secondary | ICD-10-CM | POA: Diagnosis not present

## 2017-09-01 DIAGNOSIS — E119 Type 2 diabetes mellitus without complications: Secondary | ICD-10-CM | POA: Diagnosis not present

## 2017-10-14 IMAGING — CT CT CHEST W/ CM
3 of 5 series · 16 of 46 positions shown, 18 images · IV contrast (iopamidol)
Comparison: Chest radiograph 11/26/2015.

CLINICAL DATA: Patient with history of diabetes. Infection of
unknown etiology.

EXAM:
CT CHEST, ABDOMEN, AND PELVIS WITH CONTRAST
TECHNIQUE: Multidetector CT imaging of the chest, abdomen and pelvis was
performed following the standard protocol during bolus
administration of intravenous contrast.
CONTRAST:  100mL IWRD1L-YDD IOPAMIDOL (IWRD1L-YDD) INJECTION 61%

[Series 2: cap with st · axial · 0.77mm/px · z∈[-594,-44]mm · 11 of 132 slices shown, 13 images]
[im 11/132  soft-tissue]
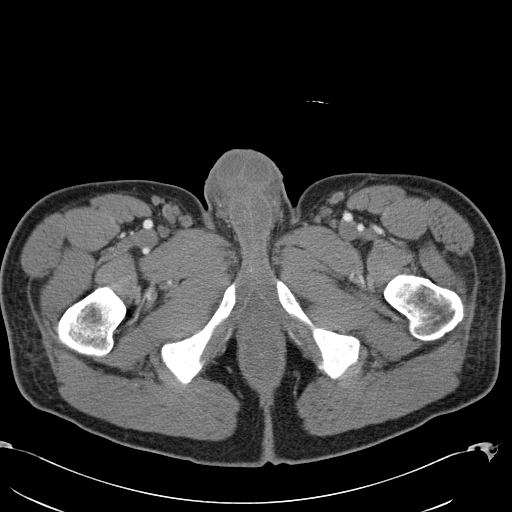
[im 11/132  bone]
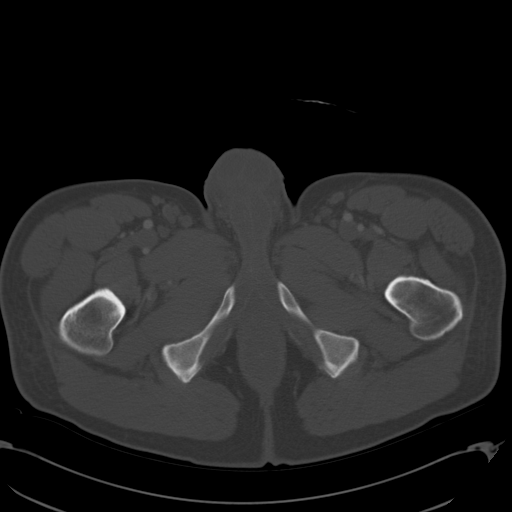
[im 22/132  soft-tissue]
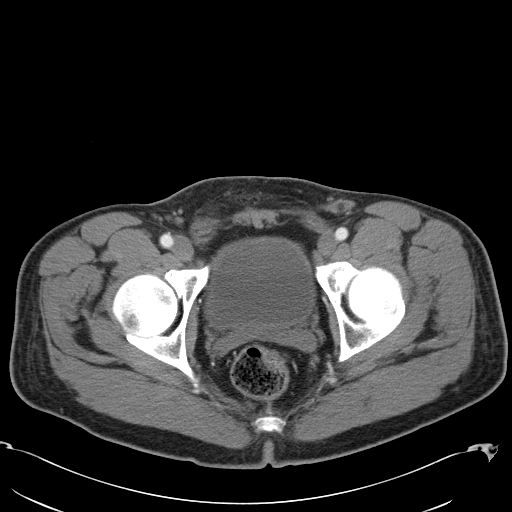
[im 33/132  soft-tissue]
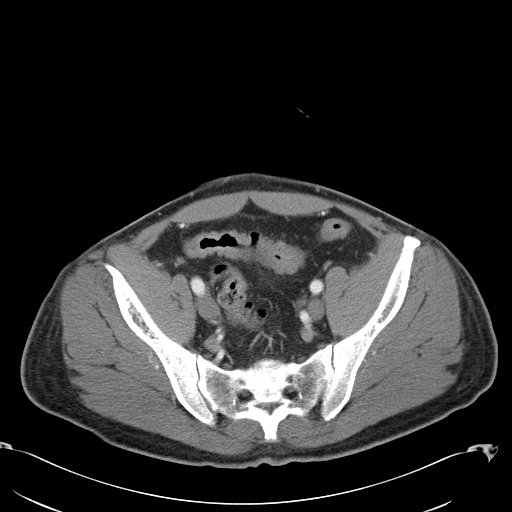
[im 44/132  soft-tissue]
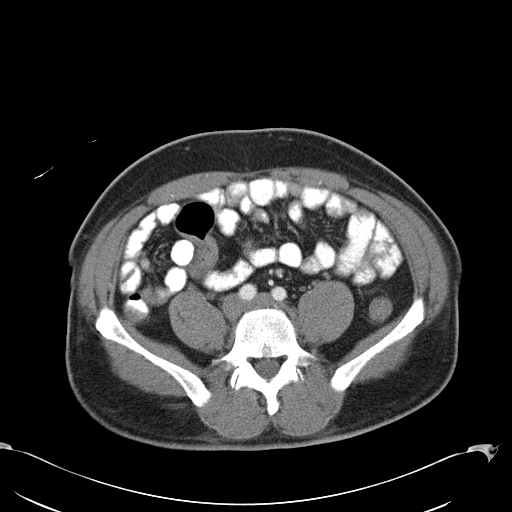
[im 55/132  soft-tissue]
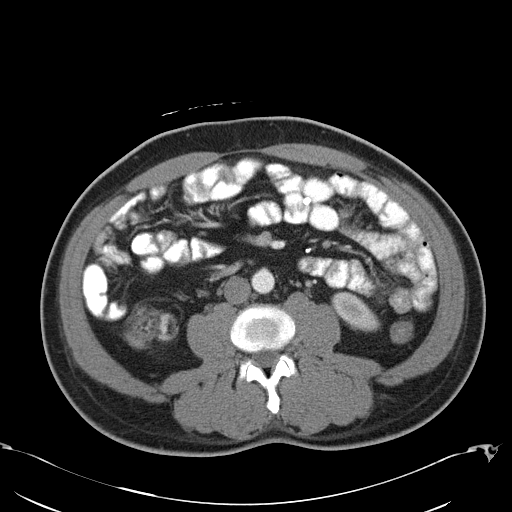
[im 66/132  soft-tissue]
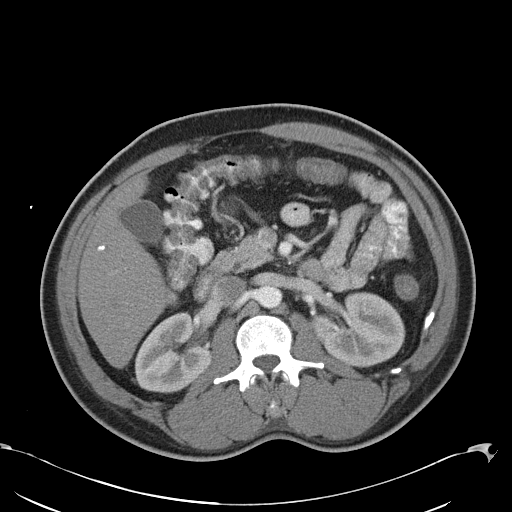
[im 77/132  soft-tissue]
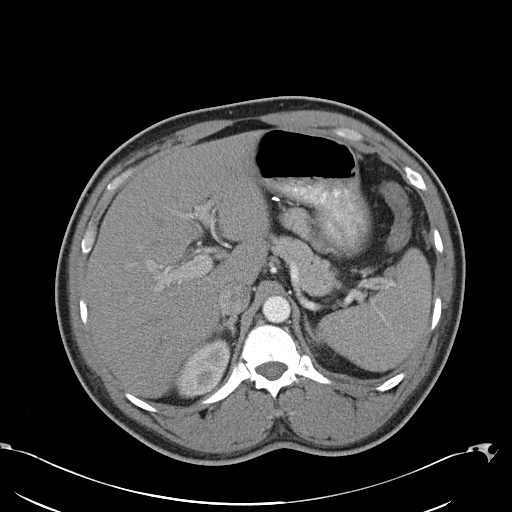
[im 88/132  soft-tissue]
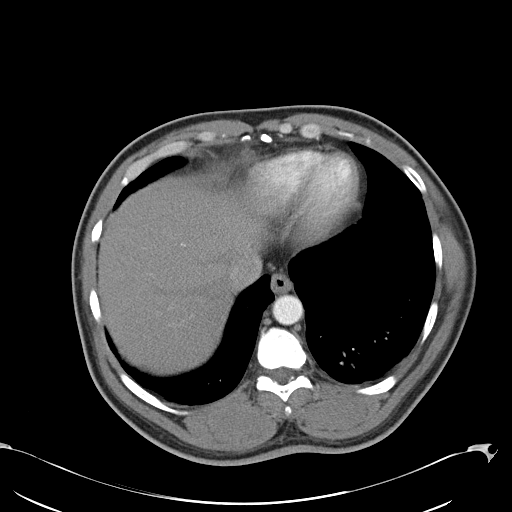
[im 99/132  soft-tissue]
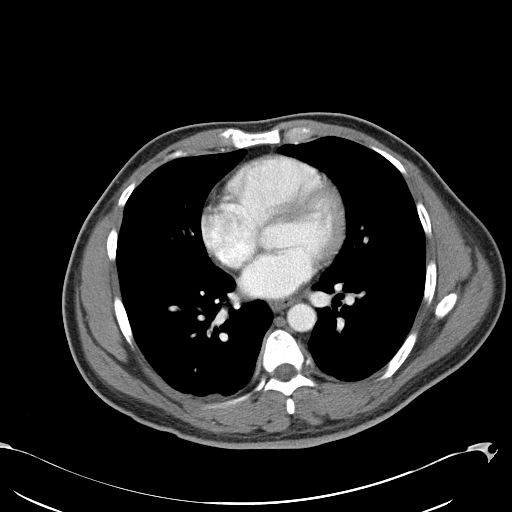
[im 99/132  bone]
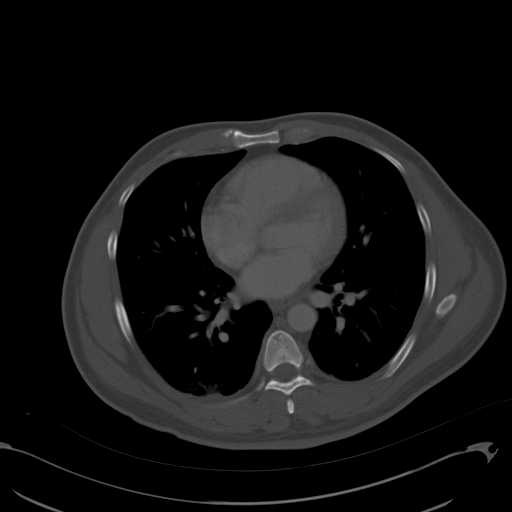
[im 110/132  soft-tissue]
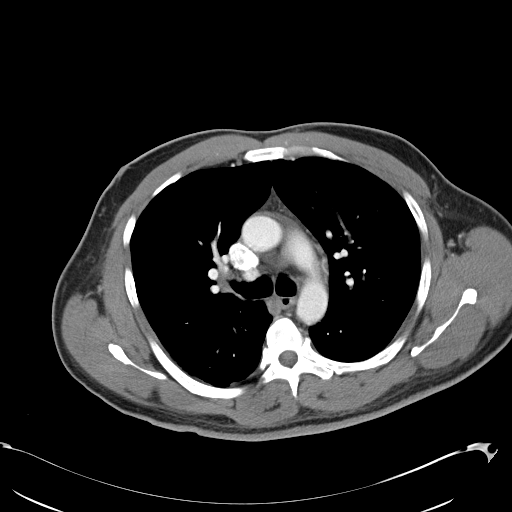
[im 121/132  soft-tissue]
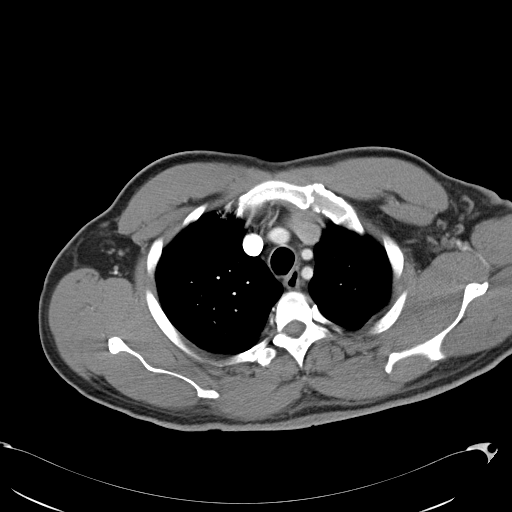

[Series 5: lung windows · axial · 0.77mm/px · z∈[-263,-221]mm · 2 of 148 slices shown]
[im 11/148  bone]
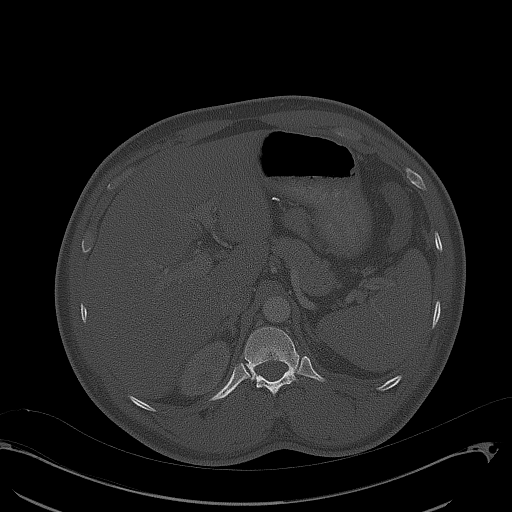
[im 32/148  bone]
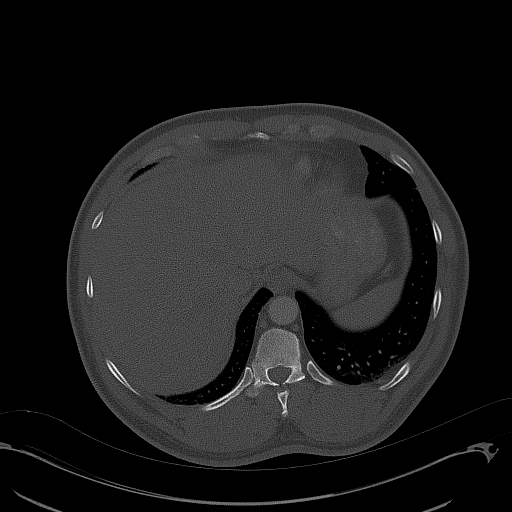

[Series 602: <mpr thick range> · coronal · 1.29mm/px · 3 of 148 slices shown]
[im 50/148  soft-tissue]
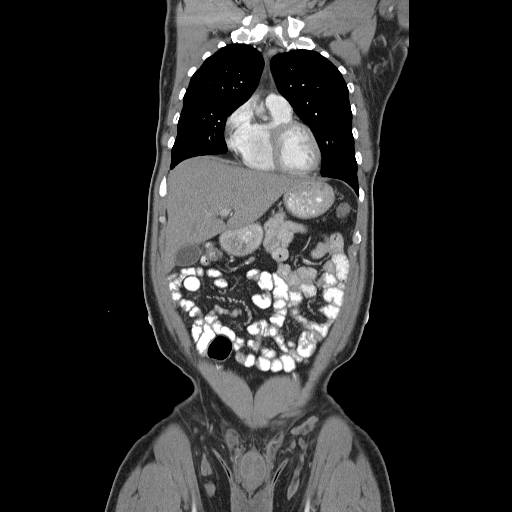
[im 66/148  soft-tissue]
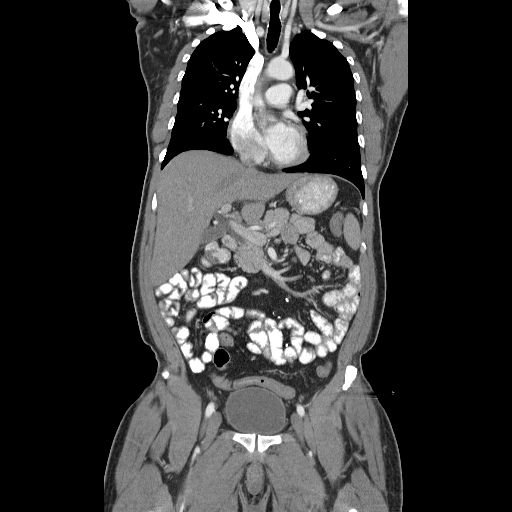
[im 82/148  soft-tissue]
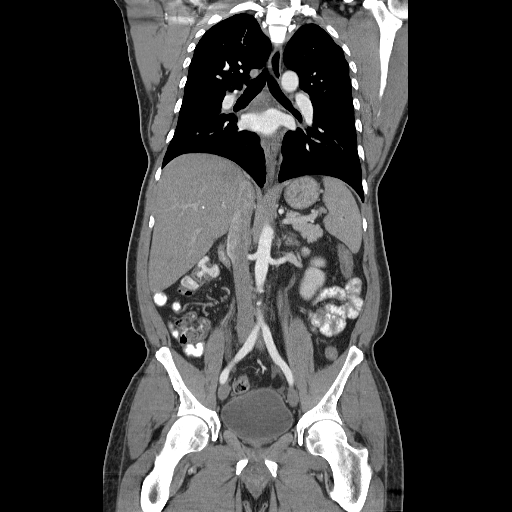

[16 of 46 positions shown; findings below may reference images not displayed]

FINDINGS: CT CHEST

Mediastinum/Nodes: Visualized thyroid is unremarkable. No enlarged
axillary, mediastinal or hilar lymphadenopathy. Prominent sub cm
mediastinal lymph nodes. Normal heart size. No pericardial effusion.
Aorta and main pulmonary artery are normal in caliber.

Lungs/Pleura: Central airways are patent. Subpleural ground-glass
and consolidative opacities are demonstrated within the bilateral
lower lobes. No pleural effusion or pneumothorax. Small calcified
granuloma right lower lobe.

Musculoskeletal: No aggressive or acute appearing osseous lesions.

CT ABDOMEN AND PELVIS

Hepatobiliary: Multiple calcified granulomas are demonstrated within
the liver. Gallbladder is unremarkable. No intrahepatic or
extrahepatic biliary ductal dilatation.

Pancreas: Unremarkable

Spleen: Unremarkable

Adrenals/Urinary Tract: The adrenal glands are normal. Kidneys
enhance symmetrically with contrast. No hydronephrosis. Urinary
bladder is unremarkable. There is subtle fat stranding about the
ureters bilaterally.

Stomach/Bowel: Normal morphology of the stomach. No abnormal bowel
wall thickening or evidence for bowel obstruction. The descending
and sigmoid colon is decompressed.

Vascular/Lymphatic: Normal caliber abdominal aorta. No
retroperitoneal lymphadenopathy.

Other: Prostate is unremarkable.

Musculoskeletal: No aggressive or acute appearing osseous lesions.
IMPRESSION: Nonspecific mild fat stranding about the ureters bilaterally.
Recommend correlation with urinalysis to exclude the possibility of
infection.

The descending and sigmoid colon is decompressed limiting
evaluation. Apparent wall thickening is likely secondary to
decompression. Recommend clinical correlation for signs of colitis.

Otherwise no acute process within the chest, abdomen or pelvis.

## 2018-04-23 DIAGNOSIS — E7849 Other hyperlipidemia: Secondary | ICD-10-CM | POA: Diagnosis not present

## 2018-04-23 DIAGNOSIS — Z23 Encounter for immunization: Secondary | ICD-10-CM | POA: Diagnosis not present

## 2018-04-23 DIAGNOSIS — E119 Type 2 diabetes mellitus without complications: Secondary | ICD-10-CM | POA: Diagnosis not present

## 2018-04-23 DIAGNOSIS — Z1211 Encounter for screening for malignant neoplasm of colon: Secondary | ICD-10-CM | POA: Diagnosis not present

## 2018-04-23 DIAGNOSIS — R498 Other voice and resonance disorders: Secondary | ICD-10-CM | POA: Diagnosis not present

## 2018-07-23 DIAGNOSIS — E119 Type 2 diabetes mellitus without complications: Secondary | ICD-10-CM | POA: Diagnosis not present

## 2018-07-23 DIAGNOSIS — E7849 Other hyperlipidemia: Secondary | ICD-10-CM | POA: Diagnosis not present

## 2019-02-13 DIAGNOSIS — E119 Type 2 diabetes mellitus without complications: Secondary | ICD-10-CM | POA: Diagnosis not present

## 2019-10-10 DIAGNOSIS — Z23 Encounter for immunization: Secondary | ICD-10-CM | POA: Diagnosis not present

## 2019-10-10 DIAGNOSIS — Z125 Encounter for screening for malignant neoplasm of prostate: Secondary | ICD-10-CM | POA: Diagnosis not present

## 2019-10-10 DIAGNOSIS — E559 Vitamin D deficiency, unspecified: Secondary | ICD-10-CM | POA: Diagnosis not present

## 2019-10-10 DIAGNOSIS — E7849 Other hyperlipidemia: Secondary | ICD-10-CM | POA: Diagnosis not present

## 2019-10-10 DIAGNOSIS — E1165 Type 2 diabetes mellitus with hyperglycemia: Secondary | ICD-10-CM | POA: Diagnosis not present

## 2019-10-10 DIAGNOSIS — Z0000 Encounter for general adult medical examination without abnormal findings: Secondary | ICD-10-CM | POA: Diagnosis not present

## 2020-01-15 DIAGNOSIS — E7849 Other hyperlipidemia: Secondary | ICD-10-CM | POA: Diagnosis not present

## 2020-01-15 DIAGNOSIS — E1165 Type 2 diabetes mellitus with hyperglycemia: Secondary | ICD-10-CM | POA: Diagnosis not present

## 2020-01-15 DIAGNOSIS — Z418 Encounter for other procedures for purposes other than remedying health state: Secondary | ICD-10-CM | POA: Diagnosis not present

## 2020-02-10 DIAGNOSIS — Z20822 Contact with and (suspected) exposure to covid-19: Secondary | ICD-10-CM | POA: Diagnosis not present

## 2020-04-17 DIAGNOSIS — E7849 Other hyperlipidemia: Secondary | ICD-10-CM | POA: Diagnosis not present

## 2020-04-17 DIAGNOSIS — E119 Type 2 diabetes mellitus without complications: Secondary | ICD-10-CM | POA: Diagnosis not present

## 2020-04-17 DIAGNOSIS — Z23 Encounter for immunization: Secondary | ICD-10-CM | POA: Diagnosis not present

## 2020-07-17 DIAGNOSIS — Z1211 Encounter for screening for malignant neoplasm of colon: Secondary | ICD-10-CM | POA: Diagnosis not present

## 2020-07-17 DIAGNOSIS — E7849 Other hyperlipidemia: Secondary | ICD-10-CM | POA: Diagnosis not present

## 2020-07-17 DIAGNOSIS — E1165 Type 2 diabetes mellitus with hyperglycemia: Secondary | ICD-10-CM | POA: Diagnosis not present

## 2020-08-18 DIAGNOSIS — E119 Type 2 diabetes mellitus without complications: Secondary | ICD-10-CM | POA: Diagnosis not present

## 2021-03-22 DIAGNOSIS — E559 Vitamin D deficiency, unspecified: Secondary | ICD-10-CM | POA: Diagnosis not present

## 2021-03-22 DIAGNOSIS — Z0000 Encounter for general adult medical examination without abnormal findings: Secondary | ICD-10-CM | POA: Diagnosis not present

## 2021-03-22 DIAGNOSIS — Z125 Encounter for screening for malignant neoplasm of prostate: Secondary | ICD-10-CM | POA: Diagnosis not present

## 2021-03-22 DIAGNOSIS — E119 Type 2 diabetes mellitus without complications: Secondary | ICD-10-CM | POA: Diagnosis not present

## 2021-03-22 DIAGNOSIS — E7849 Other hyperlipidemia: Secondary | ICD-10-CM | POA: Diagnosis not present

## 2021-03-22 DIAGNOSIS — Z1211 Encounter for screening for malignant neoplasm of colon: Secondary | ICD-10-CM | POA: Diagnosis not present

## 2021-06-23 DIAGNOSIS — E7849 Other hyperlipidemia: Secondary | ICD-10-CM | POA: Diagnosis not present

## 2021-06-23 DIAGNOSIS — E1165 Type 2 diabetes mellitus with hyperglycemia: Secondary | ICD-10-CM | POA: Diagnosis not present

## 2021-06-23 DIAGNOSIS — Z23 Encounter for immunization: Secondary | ICD-10-CM | POA: Diagnosis not present

## 2021-10-01 DIAGNOSIS — E7849 Other hyperlipidemia: Secondary | ICD-10-CM | POA: Diagnosis not present

## 2021-10-01 DIAGNOSIS — E1165 Type 2 diabetes mellitus with hyperglycemia: Secondary | ICD-10-CM | POA: Diagnosis not present

## 2021-10-01 DIAGNOSIS — Z1211 Encounter for screening for malignant neoplasm of colon: Secondary | ICD-10-CM | POA: Diagnosis not present

## 2021-11-01 DIAGNOSIS — E119 Type 2 diabetes mellitus without complications: Secondary | ICD-10-CM | POA: Diagnosis not present

## 2022-01-17 DIAGNOSIS — E1165 Type 2 diabetes mellitus with hyperglycemia: Secondary | ICD-10-CM | POA: Diagnosis not present

## 2022-01-17 DIAGNOSIS — E7849 Other hyperlipidemia: Secondary | ICD-10-CM | POA: Diagnosis not present

## 2022-01-17 DIAGNOSIS — Z418 Encounter for other procedures for purposes other than remedying health state: Secondary | ICD-10-CM | POA: Diagnosis not present

## 2022-04-25 DIAGNOSIS — E7849 Other hyperlipidemia: Secondary | ICD-10-CM | POA: Diagnosis not present

## 2022-04-25 DIAGNOSIS — Z0000 Encounter for general adult medical examination without abnormal findings: Secondary | ICD-10-CM | POA: Diagnosis not present

## 2022-04-25 DIAGNOSIS — Z23 Encounter for immunization: Secondary | ICD-10-CM | POA: Diagnosis not present

## 2022-04-25 DIAGNOSIS — E559 Vitamin D deficiency, unspecified: Secondary | ICD-10-CM | POA: Diagnosis not present

## 2022-04-25 DIAGNOSIS — Z125 Encounter for screening for malignant neoplasm of prostate: Secondary | ICD-10-CM | POA: Diagnosis not present

## 2022-04-25 DIAGNOSIS — E1165 Type 2 diabetes mellitus with hyperglycemia: Secondary | ICD-10-CM | POA: Diagnosis not present

## 2022-06-22 DIAGNOSIS — E119 Type 2 diabetes mellitus without complications: Secondary | ICD-10-CM | POA: Diagnosis not present

## 2022-09-09 DIAGNOSIS — E7849 Other hyperlipidemia: Secondary | ICD-10-CM | POA: Diagnosis not present

## 2022-09-09 DIAGNOSIS — Z1211 Encounter for screening for malignant neoplasm of colon: Secondary | ICD-10-CM | POA: Diagnosis not present

## 2022-09-09 DIAGNOSIS — E119 Type 2 diabetes mellitus without complications: Secondary | ICD-10-CM | POA: Diagnosis not present

## 2022-09-22 DIAGNOSIS — Z1211 Encounter for screening for malignant neoplasm of colon: Secondary | ICD-10-CM | POA: Diagnosis not present

## 2022-09-22 DIAGNOSIS — Z1212 Encounter for screening for malignant neoplasm of rectum: Secondary | ICD-10-CM | POA: Diagnosis not present

## 2022-12-21 DIAGNOSIS — H25813 Combined forms of age-related cataract, bilateral: Secondary | ICD-10-CM | POA: Diagnosis not present

## 2023-03-15 ENCOUNTER — Other Ambulatory Visit: Payer: Self-pay | Admitting: Internal Medicine

## 2023-03-15 DIAGNOSIS — E1165 Type 2 diabetes mellitus with hyperglycemia: Secondary | ICD-10-CM | POA: Diagnosis not present

## 2023-03-15 DIAGNOSIS — E559 Vitamin D deficiency, unspecified: Secondary | ICD-10-CM | POA: Diagnosis not present

## 2023-03-15 DIAGNOSIS — Z0000 Encounter for general adult medical examination without abnormal findings: Secondary | ICD-10-CM | POA: Diagnosis not present

## 2023-03-15 DIAGNOSIS — Z125 Encounter for screening for malignant neoplasm of prostate: Secondary | ICD-10-CM | POA: Diagnosis not present

## 2023-03-15 DIAGNOSIS — E7849 Other hyperlipidemia: Secondary | ICD-10-CM | POA: Diagnosis not present

## 2023-03-17 DIAGNOSIS — H25013 Cortical age-related cataract, bilateral: Secondary | ICD-10-CM | POA: Diagnosis not present

## 2023-03-17 DIAGNOSIS — H25043 Posterior subcapsular polar age-related cataract, bilateral: Secondary | ICD-10-CM | POA: Diagnosis not present

## 2023-03-17 DIAGNOSIS — H2513 Age-related nuclear cataract, bilateral: Secondary | ICD-10-CM | POA: Diagnosis not present

## 2023-03-17 DIAGNOSIS — H18413 Arcus senilis, bilateral: Secondary | ICD-10-CM | POA: Diagnosis not present

## 2023-03-17 DIAGNOSIS — H2512 Age-related nuclear cataract, left eye: Secondary | ICD-10-CM | POA: Diagnosis not present

## 2023-05-12 DIAGNOSIS — H2511 Age-related nuclear cataract, right eye: Secondary | ICD-10-CM | POA: Diagnosis not present

## 2023-05-12 DIAGNOSIS — H25812 Combined forms of age-related cataract, left eye: Secondary | ICD-10-CM | POA: Diagnosis not present

## 2023-05-12 DIAGNOSIS — H2512 Age-related nuclear cataract, left eye: Secondary | ICD-10-CM | POA: Diagnosis not present

## 2023-06-09 DIAGNOSIS — H2511 Age-related nuclear cataract, right eye: Secondary | ICD-10-CM | POA: Diagnosis not present

## 2023-06-09 DIAGNOSIS — H25811 Combined forms of age-related cataract, right eye: Secondary | ICD-10-CM | POA: Diagnosis not present
